# Patient Record
Sex: Female | Born: 1975 | Race: Black or African American | Hispanic: No | Marital: Married | Smoking: Never smoker
Health system: Southern US, Community
[De-identification: ages and names within clinical notes are randomized; demographics above are authoritative.]

## PROBLEM LIST (undated history)

## (undated) DIAGNOSIS — G4730 Sleep apnea, unspecified: Secondary | ICD-10-CM

## (undated) DIAGNOSIS — G709 Myoneural disorder, unspecified: Secondary | ICD-10-CM

## (undated) DIAGNOSIS — K219 Gastro-esophageal reflux disease without esophagitis: Secondary | ICD-10-CM

## (undated) HISTORY — PX: ABDOMINAL HYSTERECTOMY: SHX81

## (undated) HISTORY — PX: CHOLECYSTECTOMY: SHX55

---

## 2017-01-04 ENCOUNTER — Emergency Department
Admission: EM | Admit: 2017-01-04 | Discharge: 2017-01-04 | Discharge status: Against Medical Advice | Payer: Medicaid Other | Attending: Emergency Medicine | Admitting: Emergency Medicine

## 2017-01-04 DIAGNOSIS — K047 Periapical abscess without sinus: Secondary | ICD-10-CM | POA: Diagnosis not present

## 2017-01-04 DIAGNOSIS — K0889 Other specified disorders of teeth and supporting structures: Secondary | ICD-10-CM | POA: Diagnosis present

## 2017-01-04 MED ORDER — DEXAMETHASONE SODIUM PHOSPHATE 10 MG/ML IJ SOLN
10.0000 mg | Freq: Once | INTRAMUSCULAR | Status: DC
  Filled 2017-01-04: qty 1

## 2017-01-04 MED ORDER — AMOXICILLIN-POT CLAVULANATE 875-125 MG PO TABS: 1.0000 | ORAL_TABLET | Freq: Two times a day (BID) | ORAL | 0 refills | Status: DC

## 2017-01-04 MED ORDER — CLINDAMYCIN PHOSPHATE 600 MG/4ML IJ SOLN
600.0000 mg | Freq: Once | INTRAMUSCULAR | Status: DC
  Filled 2017-01-04: qty 4

## 2017-01-04 MED ORDER — HYDROCODONE-ACETAMINOPHEN 5-325 MG PO TABS
1.0000 | ORAL_TABLET | Freq: Once | ORAL | Status: DC
  Filled 2017-01-04: qty 1

## 2017-01-04 MED ORDER — PREDNISONE 10 MG (21) PO TBPK: ORAL_TABLET | ORAL | 0 refills | Status: DC

## 2017-01-04 MED ORDER — HYDROCODONE-ACETAMINOPHEN 5-325 MG PO TABS: 1.0000 | ORAL_TABLET | Freq: Four times a day (QID) | ORAL | 0 refills | Status: DC | PRN

## 2017-01-04 NOTE — ED Triage Notes (Signed)
Patient reports dental pain for over a week.  Patient states she went to dentist but they refused to pull her teeth.

## 2017-01-04 NOTE — ED Provider Notes (Signed)
[facility] Emergency Department Provider Note   ____________________________________________   I have reviewed the triage vital signs and the nursing notes.   HISTORY  Chief Complaint Dental Pain    HPI Emma Lowe is a 41 y.o. female presents to the emergency department with dental pain along the right and left upper gumline.  Patient reports significant pain with erythema and swelling related to multiple broken teeth and significant dental caries.  Patient recently seen by a dentist in [place] and was scheduled to have 8 teeth pulled this past Thursday. They decided not to pull the teeth and reschedule her appointment.  The patient reported none of the above symptoms while being seen this past Thursday.  She noted onset of the above symptoms over the last 2 days.  Patient reported present swelling along her cheeks and gums was approximately 1-2 times the size of her normal face.  Swelling extended up below the eyes and into the neck.  Patient denied fevers, chills, nausea, vomiting or headache.  She denies any past history of dental infection or dental abscess. Patient denies vision changes, chest pain, chest tightness, shortness of breath, abdominal pain, nausea and vomiting.  No past medical history on file.  There are no active problems to display for this patient.   No past surgical history on file.  Prior to Admission medications   Not on File    Allergies Patient has no allergy information on record.  No family history on file.  Social History Social History  Substance Use Topics  . Smoking status: Not on file  . Smokeless tobacco: Not on file  . Alcohol use Not on file    Review of Systems Constitutional: Negative for fever/chills Eyes: No visual changes. ENT:  Negative for sore throat and for difficulty swallowing.  Positive for right-sided and left-sided dental pain with erythema and swelling. Cardiovascular: Denies chest  pain. Respiratory: Denies shortness of breath. Skin: Negative for rash. Neurological: Negative for headaches. ____________________________________________   PHYSICAL EXAM:  VITAL SIGNS: ED Triage Vitals  Enc Vitals Group     BP 01/04/17 2125 (!) 160/88     Pulse Rate 01/04/17 2125 87     Resp 01/04/17 2125 20     Temp 01/04/17 2125 97.8 F (36.6 C)     Temp Source 01/04/17 2125 Axillary     SpO2 01/04/17 2125 97 %     Weight 01/04/17 2124 300 lb (136.1 kg)     Height 01/04/17 2124 5\' 8"  (1.727 m)     Head Circumference --      Peak Flow --      Pain Score --      Pain Loc --      Pain Edu? --      Excl. in GC? --     Constitutional: Alert and oriented. Well appearing and in no acute distress.  Eyes: Conjunctivae are normal. PERRL. EOMI  Head: Normocephalic and atraumatic. ENT:          Mouth/Throat: Mucous membranes are moist. Oropharynx normal.  Upper and lower gumline is erythematous with edema.  Multiple broken teeth with significant dental caries.  External swelling noted along bilateral cheek and lips. Neck:Supple. No thyromegaly. No stridor.  Cardiovascular: Normal rate, regular rhythm. Respiratory: Normal respiratory effort without tachypnea or retractions. Neurologic: Normal speech and language.  Skin:  Skin is warm, dry and intact. No rash noted. Psychiatric: Mood and affect are normal. Speech and behavior are normal. Patient exhibits  appropriate insight and judgement.  ____________________________________________   LABS (all labs ordered are listed, but only abnormal results are displayed)  Labs Reviewed - No data to display ____________________________________________  EKG none ____________________________________________  RADIOLOGY none ____________________________________________   PROCEDURES  Procedure(s) performed: no    Critical Care performed: no ____________________________________________   INITIAL IMPRESSION / ASSESSMENT AND PLAN  / ED COURSE  Pertinent labs & imaging results that were available during my care of the patient were reviewed by me and considered in my medical decision making (see chart for details).  While nursing was attempting to give patient her medications, patient was unhappy with being given Vicodin.  She stated that she wanted different pain medicines and she was not happy with her treatment.  Patient stated that "Vicodin does nothing for her and it was like being given candy".  Patient was on the phone with her husband, he he verbalized to her to request different treatment or leave the treatment room and go back out to recheck in requesting another provider.  Nursing staff asked the patient once again if she wanted to be treated, she did not clearly answer the question and verbalize that we were not treating her symptoms.  She was offered her treatment which consisted of clindamycin 600 IM, Decadron IM and Vicodin 5 mg-325.  Patient refused all medications and left the treatment room.  Patient then left the facility without signing out or being discharged.  ----------------------------------------- 11:55 PM on 01/04/2017 -----------------------------------------   ____________________________________________   FINAL CLINICAL IMPRESSION(S) / ED DIAGNOSES  Final diagnoses:  Pain, dental  Dental infection       NEW MEDICATIONS STARTED DURING THIS VISIT:  There are no discharge medications for this patient.    Note:  This document was prepared using Dragon voice recognition software and may include unintentional dictation errors.    Clois ComberLittle, Yuan Gann M, PA-C 01/04/17 2359    Phineas SemenGoodman, Graydon, MD 01/05/17 [phone]

## 2017-01-05 NOTE — ED Notes (Addendum)
Unable to pull patient back up in pyxis to waste Norco that she refused; Norco was wasted with Hansel FeinsteinAndrea Bryant, RN, charge nurse

## 2017-01-05 NOTE — ED Notes (Signed)
Pt c/o toothache pain to right side of her mouth for over a week; says her dentist refused to pull any teeth; pt with swelling to the whole right cheek; rates pain 10/10

## 2017-01-05 NOTE — ED Notes (Signed)
Primary nurse Pandora LeiterLaura Allen RN attempted to waste pt refused Norco tablet in the pyxis but was unable to locate pt profile. Pharmacy called and they were unable to help Vernona RiegerLaura RN or this nurse locate pt or find a way to waste the Norco without being able to pull up the pt profile. Unable to show wasted medication without pulling up pt profile which shows what medications were given which will show which medications were pulled up under that pt name. It was advised by pharmacy staff to waste the medication in a sharps container at the pyxis and type a note discussing the events leading up to wasting the medication. This nurse witnessed Pandora LeiterLaura Allen RN place Norco in sharps container.

## 2017-01-05 NOTE — ED Notes (Signed)
In to give pt medications prescribed by PA Little; pt refused Norco as she says "that's baby food"; pt says she takes percocet and the Norco won't help; explained to pt that I also had a steroid shot to give her and that I would go get the prescribed antibiotic after that was given; pt was on the phone with her husband at this time and called the PA in to speak with him;   Traci in to speak with husband on speaker phone to let him know what pt's plan of care would be, as that was the indicated care; husband encouraged pt to take the Norco but she continued to refuse; pt then got on the phone with another person, asking them to come [facility]; told person on phone she sat in the lobby for 2-3 hours and then in the room for 2-3 hours and nothing has been done for her; while pt was on the phone, I left for her to finish her conversation; pt was then seen by PA Little leaving the room and heading for the lobby; pt received no medications before leaving;

## 2017-03-02 ENCOUNTER — Emergency Department: Payer: Medicaid Other

## 2017-03-02 ENCOUNTER — Other Ambulatory Visit: Payer: Self-pay

## 2017-03-02 ENCOUNTER — Emergency Department
Admission: EM | Admit: 2017-03-02 | Discharge: 2017-03-02 | Disposition: A | Discharge status: Home / Self Care | Payer: Medicaid Other | Attending: Emergency Medicine | Admitting: Emergency Medicine

## 2017-03-02 ENCOUNTER — Encounter: Payer: Self-pay | Admitting: Emergency Medicine

## 2017-03-02 DIAGNOSIS — R1032 Left lower quadrant pain: Secondary | ICD-10-CM | POA: Insufficient documentation

## 2017-03-02 DIAGNOSIS — R109 Unspecified abdominal pain: Secondary | ICD-10-CM

## 2017-03-02 LAB — COMPREHENSIVE METABOLIC PANEL
ALBUMIN: 4.1 g/dL (ref 3.5–5.0)
ALT: 21 U/L (ref 14–54)
AST: 27 U/L (ref 15–41)
Alkaline Phosphatase: 95 U/L (ref 38–126)
Anion gap: 7 (ref 5–15)
BUN: 11 mg/dL (ref 6–20)
CHLORIDE: 102 mmol/L (ref 101–111)
CO2: 28 mmol/L (ref 22–32)
CREATININE: 0.8 mg/dL (ref 0.44–1.00)
Calcium: 9 mg/dL (ref 8.9–10.3)
GFR calc Af Amer: >60 mL/min (ref >60)
GFR calc non Af Amer: >60 mL/min (ref >60)
GLUCOSE: 90 mg/dL (ref 65–99)
POTASSIUM: 4.1 mmol/L (ref 3.5–5.1)
Sodium: 137 mmol/L (ref 135–145)
Total Bilirubin: 0.5 mg/dL (ref 0.3–1.2)
Total Protein: 7.7 g/dL (ref 6.5–8.1)

## 2017-03-02 LAB — URINALYSIS, COMPLETE (UACMP) WITH MICROSCOPIC
BACTERIA UA: NONE SEEN
BILIRUBIN URINE: NEGATIVE
Glucose, UA: NEGATIVE mg/dL
HGB URINE DIPSTICK: NEGATIVE
Ketones, ur: NEGATIVE mg/dL
Leukocytes, UA: NEGATIVE
NITRITE: NEGATIVE
PROTEIN: NEGATIVE mg/dL
SPECIFIC GRAVITY, URINE: 1.031 — AB (ref 1.005–1.030)
pH: 5 (ref 5.0–8.0)

## 2017-03-02 LAB — CBC
HEMATOCRIT: 35.8 % (ref 35.0–47.0)
Hemoglobin: 11.7 g/dL — ABNORMAL LOW (ref 12.0–16.0)
MCH: 30.3 pg (ref 26.0–34.0)
MCHC: 32.7 g/dL (ref 32.0–36.0)
MCV: 92.9 fL (ref 80.0–100.0)
PLATELETS: 332 10*3/uL (ref 150–440)
RBC: 3.85 MIL/uL (ref 3.80–5.20)
RDW: 13.8 % (ref 11.5–14.5)
WBC: 8.1 10*3/uL (ref 3.6–11.0)

## 2017-03-02 LAB — LIPASE, BLOOD: Lipase: 33 U/L (ref 11–51)

## 2017-03-02 MED ORDER — MORPHINE SULFATE (PF) 4 MG/ML IV SOLN: 4.0000 mg | Freq: Once | INTRAVENOUS | Status: DC

## 2017-03-02 MED ORDER — OXYCODONE-ACETAMINOPHEN 5-325 MG PO TABS
2.0000 | ORAL_TABLET | Freq: Once | ORAL | Status: AC
  Administered 2017-03-02: 2 via ORAL
  Filled 2017-03-02: qty 2

## 2017-03-02 MED ORDER — ONDANSETRON 4 MG PO TBDP
4.0000 mg | ORAL_TABLET | Freq: Once | ORAL | Status: AC
  Administered 2017-03-02: 4 mg via ORAL

## 2017-03-02 MED ORDER — ONDANSETRON 4 MG PO TBDP
ORAL_TABLET | ORAL | Status: AC
  Filled 2017-03-02: qty 1

## 2017-03-02 MED ORDER — HYDROMORPHONE HCL 1 MG/ML IJ SOLN
1.0000 mg | Freq: Once | INTRAMUSCULAR | Status: AC
  Administered 2017-03-02: 1 mg via INTRAMUSCULAR
  Filled 2017-03-02: qty 1

## 2017-03-02 MED ORDER — IOPAMIDOL (ISOVUE-300) INJECTION 61%
30.0000 mL | Freq: Once | INTRAVENOUS | Status: AC | PRN
  Administered 2017-03-02: 30 mL via ORAL

## 2017-03-02 MED ORDER — SODIUM CHLORIDE 0.9 % IV SOLN: Freq: Once | INTRAVENOUS | Status: DC

## 2017-03-02 NOTE — ED Notes (Signed)
Pt refused to sign for discharge instructions   Pt and family cursing nurse.  Pt threw discharge papers in the sink.  Pt and family also requesting a Press photographercharge nurse and Child psychotherapistsocial worker for a [employer] engine[employer] voucher.  Laney PastorStephanie rn charge nurse aware.

## 2017-03-02 NOTE — ED Notes (Signed)
Pt threw cup of water on floor and walked out.  Pt in lobby talking to bpd.  Charge nurse stephanie rn aware.

## 2017-03-02 NOTE — ED Provider Notes (Signed)
[facility] Emergency Department Provider Note       Time seen: ----------------------------------------- 11:46 AM on 03/02/2017 -----------------------------------------   I have reviewed the triage vital signs and the nursing notes.  HISTORY   Chief Complaint Abdominal Pain    HPI Emma Lowe is a 41 y.o. female with a history of C. difficile colitis who presents to the ED for left flank pain since Saturday.  Patient reports history of kidney infections as well as diverticulitis and C. difficile colitis.  Patient states she has not had C. difficile in the last 3 or 4 years.  She denies a history of kidney stones.  She has diarrhea but no nausea or vomiting.  She has not had fever or chills.  Nothing makes her symptoms better.  No past medical history on file.  There are no active problems to display for this patient.   Past Surgical History:  Procedure Laterality Date  . ABDOMINAL SURGERY      Allergies Patient has no known allergies.  Social History Social History   Tobacco Use  . Smoking status: Never Smoker  . Smokeless tobacco: Never Used  Substance Use Topics  . Alcohol use: No    Frequency: Never  . Drug use: No    Review of Systems Constitutional: Negative for fever. Cardiovascular: Negative for chest pain. Respiratory: Negative for shortness of breath. Gastrointestinal: Positive for flank pain, diarrhea Genitourinary: Negative for dysuria. Musculoskeletal: Negative for back pain. Skin: Negative for rash. Neurological: Negative for headaches, focal weakness or numbness.  All systems negative/normal/unremarkable except as stated in the HPI  ____________________________________________   PHYSICAL EXAM:  VITAL SIGNS: ED Triage Vitals  Enc Vitals Group     BP 03/02/17 1104 (!) 143/85     Pulse Rate 03/02/17 1104 73     Resp 03/02/17 1104 16     Temp 03/02/17 1104 97.9 F (36.6 C)     Temp Source 03/02/17 1104 Oral     SpO2 03/02/17 1104 100 %     Weight 03/02/17 1105 (!) 320 lb (145.2 kg)     Height 03/02/17 1105 5\' 8"  (1.727 m)     Head Circumference --      Peak Flow --      Pain Score 03/02/17 1109 5     Pain Loc --      Pain Edu? --      Excl. in GC? --     Constitutional: Alert and oriented. Well appearing and in no distress.  Morbidly obese Eyes: Conjunctivae are normal. Normal extraocular movements. ENT   Head: Normocephalic and atraumatic.   Nose: No congestion/rhinnorhea.   Mouth/Throat: Mucous membranes are moist.   Neck: No stridor. Cardiovascular: Normal rate, regular rhythm. No murmurs, rubs, or gallops. Respiratory: Normal respiratory effort without tachypnea nor retractions. Breath sounds are clear and equal bilaterally. No wheezes/rales/rhonchi. Gastrointestinal: Left flank and left CVA tenderness, no rebound or guarding.  Normal bowel sounds. Musculoskeletal: Nontender with normal range of motion in extremities. No lower extremity tenderness nor edema. Neurologic:  Normal speech and language. No gross focal neurologic deficits are appreciated.  Skin:  Skin is warm, dry and intact. No rash noted. Psychiatric: Mood and affect are normal. Speech and behavior are normal.  ____________________________________________  ED COURSE:  As part of my medical decision making, I reviewed the following data within the electronic MEDICAL RECORD NUMBER History obtained from family if available, nursing notes, old chart and ekg, as well as notes from prior  ED visits. Patient presented for flank pain, we will assess with labs and imaging as indicated at this time.   Procedures ____________________________________________   LABS (pertinent positives/negatives)  Labs Reviewed  URINALYSIS, COMPLETE (UACMP) WITH MICROSCOPIC - Abnormal; Notable for the following components:      Result Value   Color, Urine YELLOW (*)    APPearance CLEAR (*)    Specific Gravity, Urine 1.031 (*)     Squamous Epithelial / LPF 6-30 (*)    All other components within normal limits  C DIFFICILE QUICK SCREEN W PCR REFLEX  GASTROINTESTINAL PANEL BY PCR, STOOL (REPLACES STOOL CULTURE)  CBC  COMPREHENSIVE METABOLIC PANEL  LIPASE, BLOOD  BLOOD GAS, ARTERIAL    RADIOLOGY  CT renal protocol IMPRESSION: 1. No acute abdominal or pelvic pathology.  ____________________________________________  DIFFERENTIAL DIAGNOSIS   UTI, renal colic, pyelonephritis, C. difficile colitis, diverticulitis, dehydration, electrolyte abnormality  FINAL ASSESSMENT AND PLAN  Flank pain   Plan: Patient had presented for flank pain. Patient's labs are unremarkable. Patient's imaging was also unremarkable.  No clear etiology for her flank pain at this time.  She was given Dilaudid with improvement in her pain.  She states she has chronic back pain but this is different.  At this point I have not found a source for her pain, overall she is stable for outpatient follow-up.   Emily FilbertWilliams, Caili Escalera E, MD   Note: This note was generated in part or whole with voice recognition software. Voice recognition is usually quite accurate but there are transcription errors that can and very often do occur. I apologize for any typographical errors that were not detected and corrected.     Emily FilbertWilliams, Cassady Stanczak E, MD 03/02/17 [phone]

## 2017-03-02 NOTE — ED Triage Notes (Signed)
Pt ambulatory to triage with reports of having abd pain since Saturday, pt with hx of kidney infection and diverticulitis.

## 2017-03-02 NOTE — ED Notes (Signed)
Attempted for IV access without success x1

## 2017-03-06 ENCOUNTER — Emergency Department (HOSPITAL_COMMUNITY)
Admission: EM | Admit: 2017-03-06 | Discharge: 2017-03-07 | Disposition: A | Discharge status: Home / Self Care | Payer: Medicaid Other | Attending: Emergency Medicine | Admitting: Emergency Medicine

## 2017-03-06 DIAGNOSIS — R109 Unspecified abdominal pain: Secondary | ICD-10-CM | POA: Diagnosis present

## 2017-03-06 DIAGNOSIS — R1084 Generalized abdominal pain: Secondary | ICD-10-CM | POA: Insufficient documentation

## 2017-03-06 DIAGNOSIS — R197 Diarrhea, unspecified: Secondary | ICD-10-CM | POA: Diagnosis not present

## 2017-03-06 DIAGNOSIS — R112 Nausea with vomiting, unspecified: Secondary | ICD-10-CM

## 2017-03-06 DIAGNOSIS — R1012 Left upper quadrant pain: Secondary | ICD-10-CM

## 2017-03-06 LAB — URINALYSIS, ROUTINE W REFLEX MICROSCOPIC
BACTERIA UA: NONE SEEN
BILIRUBIN URINE: NEGATIVE
Glucose, UA: NEGATIVE mg/dL
KETONES UR: NEGATIVE mg/dL
Nitrite: NEGATIVE
Protein, ur: NEGATIVE mg/dL
Specific Gravity, Urine: 1.028 (ref 1.005–1.030)
pH: 5 (ref 5.0–8.0)

## 2017-03-06 NOTE — ED Triage Notes (Signed)
Pt complains of LLQ abdominal pain since 12/15, which she states has gotten worse. Pt also has n/v/d. Pt has hx of diverticulitis and C. Diff.

## 2017-03-06 NOTE — ED Notes (Signed)
Pt requested that she would prefer to have blood drawn once she gets a room.

## 2017-03-06 NOTE — ED Notes (Signed)
Writer went into patients room and introduced themselves and let patient know what they were there for. Patient said "ok" but informed writer "I am a hard stick and either need an ultrasound IV or an EJ and I do not want to be stuck". Writer confirmed and informed Lillia AbedLindsay, Charity fundraiserN.

## 2017-03-07 LAB — COMPREHENSIVE METABOLIC PANEL
ALK PHOS: 101 U/L (ref 38–126)
ALT: 25 U/L (ref 14–54)
AST: 27 U/L (ref 15–41)
Albumin: 4.4 g/dL (ref 3.5–5.0)
Anion gap: 8 (ref 5–15)
BILIRUBIN TOTAL: 0.4 mg/dL (ref 0.3–1.2)
BUN: 14 mg/dL (ref 6–20)
CALCIUM: 9.5 mg/dL (ref 8.9–10.3)
CHLORIDE: 102 mmol/L (ref 101–111)
CO2: 26 mmol/L (ref 22–32)
CREATININE: 0.88 mg/dL (ref 0.44–1.00)
Glucose, Bld: 102 mg/dL — ABNORMAL HIGH (ref 65–99)
Potassium: 3.6 mmol/L (ref 3.5–5.1)
Sodium: 136 mmol/L (ref 135–145)
Total Protein: 8.4 g/dL — ABNORMAL HIGH (ref 6.5–8.1)

## 2017-03-07 LAB — CBC
HCT: 37.6 % (ref 36.0–46.0)
Hemoglobin: 12.7 g/dL (ref 12.0–15.0)
MCH: 30.8 pg (ref 26.0–34.0)
MCHC: 33.8 g/dL (ref 30.0–36.0)
MCV: 91 fL (ref 78.0–100.0)
PLATELETS: 313 10*3/uL (ref 150–400)
RBC: 4.13 MIL/uL (ref 3.87–5.11)
RDW: 13.3 % (ref 11.5–15.5)
WBC: 10 10*3/uL (ref 4.0–10.5)

## 2017-03-07 LAB — LIPASE, BLOOD: LIPASE: 32 U/L (ref 11–51)

## 2017-03-07 LAB — I-STAT BETA HCG BLOOD, ED (MC, WL, AP ONLY)

## 2017-03-07 MED ORDER — PROMETHAZINE HCL 25 MG PO TABS: 25.0000 mg | ORAL_TABLET | Freq: Four times a day (QID) | ORAL | 0 refills | Status: DC | PRN

## 2017-03-07 MED ORDER — ONDANSETRON 8 MG PO TBDP
8.0000 mg | ORAL_TABLET | Freq: Once | ORAL | Status: AC
  Administered 2017-03-07: 8 mg via ORAL
  Filled 2017-03-07: qty 1

## 2017-03-07 MED ORDER — ONDANSETRON 8 MG PO TBDP: 8.0000 mg | ORAL_TABLET | Freq: Three times a day (TID) | ORAL | 0 refills | Status: DC | PRN

## 2017-03-07 MED ORDER — PROMETHAZINE HCL 25 MG/ML IJ SOLN
25.0000 mg | Freq: Once | INTRAMUSCULAR | Status: DC
  Filled 2017-03-07: qty 1

## 2017-03-07 MED ORDER — HYDROMORPHONE HCL 1 MG/ML IJ SOLN
1.0000 mg | Freq: Once | INTRAMUSCULAR | Status: AC
  Administered 2017-03-07: 1 mg via INTRAMUSCULAR
  Filled 2017-03-07: qty 1

## 2017-03-07 MED ORDER — SODIUM CHLORIDE 0.9 % IV BOLUS (SEPSIS): 1000.0000 mL | Freq: Once | INTRAVENOUS | Status: DC

## 2017-03-07 MED ORDER — DICYCLOMINE HCL 20 MG PO TABS
20.0000 mg | ORAL_TABLET | Freq: Two times a day (BID) | ORAL | 0 refills | Status: DC
Start: 2017-03-07 — End: 2017-06-13

## 2017-03-07 MED ORDER — ONDANSETRON HCL 4 MG/2ML IJ SOLN
4.0000 mg | Freq: Once | INTRAMUSCULAR | Status: DC
  Filled 2017-03-07: qty 2

## 2017-03-07 NOTE — ED Provider Notes (Signed)
[facility]-EMERGENCY DEPT Provider Note   CSN: [id number] Arrival date & time: 03/06/17  1642     History   Chief Complaint Chief Complaint  Patient presents with  . Abdominal Pain    HPI Emma Lowe is a 41 y.o. female.  Patient presents for left sided abdominal pain persistent x 5 days. She reports nausea with vomiting and diarrhea. No hematemesis or bloody stool. She was seen and evaluated at [facility] on 03/02/17, the day after symptoms began, and, per patient, "they couldn't find anything wrong with me". No fever, chest pain, urinary symptoms, SOB or cough.   The history is provided by the patient. No language interpreter was used.    No past medical history on file.  There are no active problems to display for this patient.   Past Surgical History:  Procedure Laterality Date  . ABDOMINAL SURGERY      OB History    No data available       Home Medications    Prior to Admission medications   Not on File    Family History No family history on file.  Social History Social History   Tobacco Use  . Smoking status: Never Smoker  . Smokeless tobacco: Never Used  Substance Use Topics  . Alcohol use: No    Frequency: Never  . Drug use: No     Allergies   Patient has no known allergies.   Review of Systems Review of Systems  Constitutional: Negative for chills and fever.  Respiratory: Negative.  Negative for shortness of breath.   Cardiovascular: Negative.  Negative for chest pain.  Gastrointestinal: Positive for abdominal pain, diarrhea, nausea and vomiting.  Genitourinary: Negative.  Negative for dysuria.  Musculoskeletal: Negative.  Negative for myalgias.  Skin: Negative.   Neurological: Negative.  Negative for weakness.     Physical Exam Updated Vital Signs BP (!) 127/91 (BP Location: Left Arm)   Pulse 93   Temp 98.6 F (37 C) (Oral)   Resp 18   SpO2 100%   Physical Exam  Constitutional: She appears  well-developed and well-nourished.  HENT:  Head: Normocephalic.  Neck: Normal range of motion. Neck supple.  Cardiovascular: Normal rate and regular rhythm.  Pulmonary/Chest: Effort normal and breath sounds normal. She has no rhonchi. She has no rales.  Abdominal: Soft. Bowel sounds are normal. There is generalized tenderness. There is CVA tenderness. There is no rebound and no guarding.  Musculoskeletal: Normal range of motion.  Neurological: She is alert. No cranial nerve deficit.  Skin: Skin is warm and dry. No rash noted.  Psychiatric: She has a normal mood and affect.     ED Treatments / Results  Labs (all labs ordered are listed, but only abnormal results are displayed) Labs Reviewed  URINALYSIS, ROUTINE W REFLEX MICROSCOPIC - Abnormal; Notable for the following components:      Result Value   Hgb urine dipstick MODERATE (*)    Leukocytes, UA SMALL (*)    Squamous Epithelial / LPF 6-30 (*)    All other components within normal limits  LIPASE, BLOOD  COMPREHENSIVE METABOLIC PANEL  CBC  I-STAT BETA HCG BLOOD, ED ([facility], [facility], [facility] ONLY)   Results for orders placed or performed during the hospital encounter of 03/06/17  Lipase, blood  Result Value Ref Range   Lipase 32 11 - 51 U/L  Comprehensive metabolic panel  Result Value Ref Range   Sodium 136 135 - 145 mmol/L   Potassium 3.6  3.5 - 5.1 mmol/L   Chloride 102 101 - 111 mmol/L   CO2 26 22 - 32 mmol/L   Glucose, Bld 102 (H) 65 - 99 mg/dL   BUN 14 6 - 20 mg/dL   Creatinine, Ser 4.090.88 0.44 - 1.00 mg/dL   Calcium 9.5 8.9 - 81.110.3 mg/dL   Total Protein 8.4 (H) 6.5 - 8.1 g/dL   Albumin 4.4 3.5 - 5.0 g/dL   AST 27 15 - 41 U/L   ALT 25 14 - 54 U/L   Alkaline Phosphatase 101 38 - 126 U/L   Total Bilirubin 0.4 0.3 - 1.2 mg/dL   GFR calc non Af Amer >60 >60 mL/min   GFR calc Af Amer >60 >60 mL/min   Anion gap 8 5 - 15  CBC  Result Value Ref Range   WBC 10.0 4.0 - 10.5 K/uL   RBC 4.13 3.87 - 5.11 MIL/uL   Hemoglobin 12.7 12.0 -  15.0 g/dL   HCT 91.437.6 78.236.0 - 95.646.0 %   MCV 91.0 78.0 - 100.0 fL   MCH 30.8 26.0 - 34.0 pg   MCHC 33.8 30.0 - 36.0 g/dL   RDW 21.313.3 08.611.5 - 57.815.5 %   Platelets 313 150 - 400 K/uL  Urinalysis, Routine w reflex microscopic  Result Value Ref Range   Color, Urine YELLOW YELLOW   APPearance CLEAR CLEAR   Specific Gravity, Urine 1.028 1.005 - 1.030   pH 5.0 5.0 - 8.0   Glucose, UA NEGATIVE NEGATIVE mg/dL   Hgb urine dipstick MODERATE (A) NEGATIVE   Bilirubin Urine NEGATIVE NEGATIVE   Ketones, ur NEGATIVE NEGATIVE mg/dL   Protein, ur NEGATIVE NEGATIVE mg/dL   Nitrite NEGATIVE NEGATIVE   Leukocytes, UA SMALL (A) NEGATIVE   RBC / HPF 0-5 0 - 5 RBC/hpf   WBC, UA 6-30 0 - 5 WBC/hpf   Bacteria, UA NONE SEEN NONE SEEN   Squamous Epithelial / LPF 6-30 (A) NONE SEEN   Mucus PRESENT   I-Stat beta hCG blood, ED  Result Value Ref Range   I-stat hCG, quantitative <5.0 <5 mIU/mL   Comment 3            EKG  EKG Interpretation None       Radiology No results found.  Procedures Procedures (including critical care time)  Medications Ordered in ED Medications  ondansetron (ZOFRAN-ODT) disintegrating tablet 8 mg (not administered)     Initial Impression / Assessment and Plan / ED Course  I have reviewed the triage vital signs and the nursing notes.  Pertinent labs & imaging results that were available during my care of the patient were reviewed by me and considered in my medical decision making (see chart for details).     Patient with left sided abdominal pain x 5 days - generalized abdominal pain on exam. VSS.   Chart reviewed. 03/02/17 CT renal study negative for any pathology. Labs unremarkable. VSS at that time.   She is well appearing. In NAD. Will wait to review repeat labs. Do not feel further imaging is necessary at this time. She is from [place] and is due to return home tomorrow. May consider Cipro/Flagyl with history of diverticulitis.  IV attempted but unsuccessful, even  with IV Team consult and attempt with US. IM medications ordered with PO fluid challenge for hydration. Explained care plan to patient.  Patient is tolerating PO fluids without emesis. No diarrhea in the ED. IM Phenergan provided for patient comfort with continued nausea.  Recheck labs from 12/24 continue to be unremarkable. She is considered stable for discharge home without need for repeat imaging. Dr. Nicanor Alcon aware of care plan and agrees.   Final Clinical Impressions(s) / ED Diagnoses   Final diagnoses:  None   1. Abdominal pain 2. Marca Ancona, D  ED Discharge Orders    None       Elpidio Anis, Cordelia Poche 03/07/17 0345    Palumbo, April, MD 03/07/17 [phone]

## 2017-03-07 NOTE — ED Notes (Signed)
Pt rude to staff throughout stay. Upon discharge writer was going over discharge instructions and pt continued to mumble and cuss. Writer had pt sign out after covering discharge instructions and as pt walked out and called staff "rude ass bitches".

## 2017-03-07 NOTE — Discharge Instructions (Signed)
Your repeat lab studies are normal. There is no indication for repeat imaging tonight. You can be discharged home with Phenergan for nausea and Bentyl for abdominal pain.

## 2017-03-07 NOTE — ED Notes (Signed)
Pt demanding ultrasound IV. Will consult staff capable of ultrasound IV.

## 2017-03-07 NOTE — ED Notes (Signed)
Pt states that she's a hard stick and had an EJ IV last, PA aware and IV consult placed

## 2017-06-13 ENCOUNTER — Other Ambulatory Visit: Payer: Self-pay

## 2017-06-13 ENCOUNTER — Emergency Department (HOSPITAL_COMMUNITY)
Admission: EM | Admit: 2017-06-13 | Discharge: 2017-06-14 | Disposition: A | Discharge status: Home / Self Care | Payer: Medicaid Other | Attending: Emergency Medicine | Admitting: Emergency Medicine

## 2017-06-13 ENCOUNTER — Encounter (HOSPITAL_COMMUNITY): Payer: Self-pay | Admitting: *Deleted

## 2017-06-13 ENCOUNTER — Emergency Department (HOSPITAL_COMMUNITY): Payer: Medicaid Other

## 2017-06-13 DIAGNOSIS — R079 Chest pain, unspecified: Secondary | ICD-10-CM | POA: Diagnosis present

## 2017-06-13 DIAGNOSIS — R0781 Pleurodynia: Secondary | ICD-10-CM | POA: Insufficient documentation

## 2017-06-13 DIAGNOSIS — Z79899 Other long term (current) drug therapy: Secondary | ICD-10-CM | POA: Insufficient documentation

## 2017-06-13 LAB — CBC
HCT: 34.3 % — ABNORMAL LOW (ref 36.0–46.0)
HEMOGLOBIN: 11.2 g/dL — AB (ref 12.0–15.0)
MCH: 30.4 pg (ref 26.0–34.0)
MCHC: 32.7 g/dL (ref 30.0–36.0)
MCV: 93 fL (ref 78.0–100.0)
PLATELETS: 284 10*3/uL (ref 150–400)
RBC: 3.69 MIL/uL — AB (ref 3.87–5.11)
RDW: 13.1 % (ref 11.5–15.5)
WBC: 11.3 10*3/uL — ABNORMAL HIGH (ref 4.0–10.5)

## 2017-06-13 LAB — BASIC METABOLIC PANEL
ANION GAP: 11 (ref 5–15)
BUN: 12 mg/dL (ref 6–20)
CALCIUM: 9.3 mg/dL (ref 8.9–10.3)
CO2: 23 mmol/L (ref 22–32)
Chloride: 106 mmol/L (ref 101–111)
Creatinine, Ser: 0.98 mg/dL (ref 0.44–1.00)
Glucose, Bld: 89 mg/dL (ref 65–99)
Potassium: 3.5 mmol/L (ref 3.5–5.1)
SODIUM: 140 mmol/L (ref 135–145)

## 2017-06-13 LAB — TROPONIN I: Troponin I: <0.03 ng/mL (ref <0.03)

## 2017-06-13 LAB — D-DIMER, QUANTITATIVE (NOT AT ARMC): D DIMER QUANT: 4.7 ug{FEU}/mL — AB (ref 0.00–0.50)

## 2017-06-13 MED ORDER — LIDOCAINE 5 % EX PTCH: 1.0000 | MEDICATED_PATCH | CUTANEOUS | 0 refills | Status: DC

## 2017-06-13 MED ORDER — KETOROLAC TROMETHAMINE 15 MG/ML IJ SOLN
15.0000 mg | Freq: Once | INTRAMUSCULAR | Status: AC
  Administered 2017-06-13: 15 mg via INTRAVENOUS
  Filled 2017-06-13: qty 1

## 2017-06-13 MED ORDER — ACETAMINOPHEN 325 MG PO TABS
650.0000 mg | ORAL_TABLET | Freq: Once | ORAL | Status: AC
  Administered 2017-06-13: 650 mg via ORAL
  Filled 2017-06-13: qty 2

## 2017-06-13 MED ORDER — ENOXAPARIN SODIUM 150 MG/ML ~~LOC~~ SOLN
1.0000 mg/kg | Freq: Once | SUBCUTANEOUS | Status: AC
  Administered 2017-06-14: 145 mg via SUBCUTANEOUS
  Filled 2017-06-13: qty 0.97

## 2017-06-13 MED ORDER — METHOCARBAMOL 500 MG PO TABS: 500.0000 mg | ORAL_TABLET | Freq: Two times a day (BID) | ORAL | 0 refills | Status: DC

## 2017-06-13 MED ORDER — LIDOCAINE 5 % EX PTCH
1.0000 | MEDICATED_PATCH | CUTANEOUS | Status: DC
  Administered 2017-06-13: 1 via TRANSDERMAL
  Filled 2017-06-13: qty 1

## 2017-06-13 MED ORDER — CYCLOBENZAPRINE HCL 10 MG PO TABS
10.0000 mg | ORAL_TABLET | Freq: Once | ORAL | Status: AC
  Administered 2017-06-13: 10 mg via ORAL
  Filled 2017-06-13: qty 1

## 2017-06-13 MED ORDER — IOPAMIDOL (ISOVUE-370) INJECTION 76%
INTRAVENOUS | Status: AC
  Administered 2017-06-13: 100 mL
  Filled 2017-06-13: qty 100

## 2017-06-13 MED ORDER — NAPROXEN 375 MG PO TABS: 375.0000 mg | ORAL_TABLET | Freq: Two times a day (BID) | ORAL | 0 refills | Status: DC

## 2017-06-13 NOTE — ED Triage Notes (Signed)
The pt is c/o lt sided upper chest pain for 40 minutes.  The pain started while she was walking no sob nausea or dizziness. lmp none

## 2017-06-13 NOTE — ED Provider Notes (Signed)
Care assumed from previous provider Dr. Pecola Leisureeese. Please see their note for further details to include full history and physical. To summarize in short pt is a 42-year-old female presents to the ED complaining of pleuritic chest pain.. Case discussed, plan agreed upon.  Time of care handoff was awaiting PE study given elevated d-dimer.  Also awaiting repeat troponin EKG.  CTA chest revealed no PE.  Does note a pulmonary nodule that recommends 6532-month follow-up.  Negative delta troponins.  Repeat EKG shows normal sinus rhythm.  Patient's pain is been controlled with Flexeril, lidocaine patch and Toradol.  Prior provider has asked that I order an outpatient ultrasound of the left lower leg given the patient's complaint of swelling and elevated d-dimer.  Patient will be given 1 dose of subcu Lovenox.  Patient to be discharged home with lidocaine patch, anti-inflammatories and muscle relaxers.  Suspect musculoskeletal in nature given the pain is reproducible on palpation.  Pt is hemodynamically stable, in NAD, & able to ambulate in the ED. Evaluation does not show pathology that would require ongoing emergent intervention or inpatient treatment. I explained the diagnosis to the patient. Pain has been managed & has no complaints prior to dc. Pt is comfortable with above plan and is stable for discharge at this time. All questions were answered prior to disposition. Strict return precautions for f/u to the ED were discussed. Encouraged follow up with PCP.        Rise MuLeaphart, Haylo Fake T, PA-C 06/13/17 2353    Tilden Fossaees, Elizabeth, MD 06/14/17 1153

## 2017-06-13 NOTE — ED Provider Notes (Signed)
[facility]   CSN: [id number] Arrival date & time: 06/13/17  1848     History   Chief Complaint Chief Complaint  Patient presents with  . Chest Pain    HPI Emma Lowe is a 42 y.o. female.  The history is provided by the patient. No language interpreter was used.  Chest Pain      Emma Lowe is a 42 y.o. female who presents to the Emergency Department complaining of chest pain. She reports sharp, right-sided chest pain that began about one hour prior to ED arrival. She has associated shortness of breath. Pain is nonradiating and worse with deep breaths. She denies any fevers, cough, abdominal pain, nausea, vomiting. She also reports one week of left lower extremity swelling. No prior similar symptoms. No recent surgeries. She takes no hormones. She is not a smoker. Symptoms are moderate and constant in nature.  History reviewed. No pertinent past medical history.  There are no active problems to display for this patient.   Past Surgical History:  Procedure Laterality Date  . ABDOMINAL SURGERY       OB History   None      Home Medications    Prior to Admission medications   Medication Sig Start Date End Date Taking? Authorizing Provider  ibuprofen (ADVIL,MOTRIN) 800 MG tablet Take 800 mg by mouth every 8 (eight) hours as needed for headache or mild pain.   Yes [provider]  oxyCODONE-acetaminophen (PERCOCET) 10-325 MG tablet Take 1 tablet by mouth every 8 (eight) hours as needed for pain (severe pain).   Yes [provider]  pantoprazole (PROTONIX) 20 MG tablet Take 20 mg by mouth daily.   Yes [provider]    Family History No family history on file.  Social History Social History   Tobacco Use  . Smoking status: Never Smoker  . Smokeless tobacco: Never Used  Substance Use Topics  . Alcohol use: No    Frequency: Never  . Drug use: No     Allergies    Other   Review of Systems Review of Systems  Cardiovascular: Positive for chest pain.  All other systems reviewed and are negative.    Physical Exam Updated Vital Signs BP (!) 151/112   Pulse (!) 58   Temp 98.2 F (36.8 C) (Oral)   Resp 15   Ht 5\' 9"  (1.753 m)   Wt (!) 145.2 kg (320 lb)   SpO2 100%   BMI 47.26 kg/m   Physical Exam  Constitutional: She is oriented to person, place, and time. She appears well-developed and well-nourished.  HENT:  Head: Normocephalic and atraumatic.  Cardiovascular: Normal rate and regular rhythm.  No murmur heard. Pulmonary/Chest: Effort normal and breath sounds normal. No respiratory distress.  Mild right-sided chest wall tenderness to palpation  Abdominal: Soft. There is no tenderness. There is no rebound and no guarding.  Musculoskeletal: She exhibits no edema or tenderness.  Neurological: She is alert and oriented to person, place, and time.  Skin: Skin is warm and dry.  Psychiatric: She has a normal mood and affect. Her behavior is normal.  Nursing note and vitals reviewed.    ED Treatments / Results  Labs (all labs ordered are listed, but only abnormal results are displayed) Labs Reviewed  CBC - Abnormal; Notable for the following components:      Result Value   WBC 11.3 (*)    RBC 3.69 (*)    Hemoglobin  11.2 (*)    HCT 34.3 (*)    All other components within normal limits  D-DIMER, QUANTITATIVE (NOT AT [facility]) - Abnormal; Notable for the following components:   D-Dimer, Quant 4.70 (*)    All other components within normal limits  BASIC METABOLIC PANEL  TROPONIN I    EKG EKG Interpretation  Date/Time:  Saturday June 13 2017 18:53:48 EDT Ventricular Rate:  71 PR Interval:  190 QRS Duration: 70 QT Interval:  386 QTC Calculation: 419 R Axis:   61 Text Interpretation:  Normal sinus rhythm Cannot rule out Anterior infarct , age undetermined Abnormal ECG no prior available for comparison.  Artifact Confirmed by Tilden Fossa [id number]) on 06/13/2017 7:08:52 PM   Radiology Dg Chest 2 View  Result Date: 06/13/2017 CLINICAL DATA:  Right-sided chest pain for 1 hour. EXAM: CHEST - 2 VIEW COMPARISON:  None. FINDINGS: The heart size and mediastinal contours are within normal limits. Both lungs are clear. No pleural effusion or pneumothorax. The visualized skeletal structures are unremarkable. IMPRESSION: No active cardiopulmonary disease. Electronically Signed   By: Amie Portland M.D.   On: 06/13/2017 19:30    Procedures Procedures (including critical care time)  Medications Ordered in ED Medications  acetaminophen (TYLENOL) tablet 650 mg (650 mg Oral Given 06/13/17 2039)     Initial Impression / Assessment and Plan / ED Course  I have reviewed the triage vital signs and the nursing notes.  Pertinent labs & imaging results that were available during my care of the patient were reviewed by me and considered in my medical decision making (see chart for details).     Pt here for evaluation of right sided pleuritic chest pain.  She is nontoxic on exam with no respiratory distress.  Pain is reproducible with palpation.  Ddimer obtained given complaint of asymmetric lower extremity edema. Ddimer is elevated - plan to obtain CT to rule out PE.  Patient care transferred pending CT, repeat trop.  If imaging and labs are negative plan to d/c home with return for lower extremity venous ultrasound tomorrow.    Final Clinical Impressions(s) / ED Diagnoses   Final diagnoses:  Pleuritic chest pain    ED Discharge Orders    None       Tilden Fossa, MD 06/13/17 2115

## 2017-06-13 NOTE — Discharge Instructions (Signed)
Your lab work and imaging has been reassuring.  No signs of a blood clot.  Your heart test are normal.  Your chest pain may be related to musculoskeletal pain.  Please take the Naproxen as prescribed for pain. Do not take any additional NSAIDs including Motrin, Aleve, Ibuprofen, Advil. Please the the robaxin for muscle relaxation. This medication will make you drowsy so avoid situation that could place you in danger.   I have discussed the pulmonary nodule and that recommends 59-month follow-up.  You did not need to follow-up with a primary care doctor.  Return to the ED with any worsening symptoms.  Have given you an outpatient ultrasound of your leg for your leg swelling.  Make sure you return to [facility] at 8:00 in the morning for your ultrasound.

## 2017-06-13 NOTE — ED Notes (Signed)
Patient transported to X-ray 

## 2017-06-14 ENCOUNTER — Ambulatory Visit (HOSPITAL_COMMUNITY): Admission: RE | Admit: 2017-06-14 | Payer: Medicaid Other | Source: Ambulatory Visit

## 2017-06-23 ENCOUNTER — Inpatient Hospital Stay (HOSPITAL_COMMUNITY)
Admission: EM | Admit: 2017-06-23 | Discharge: 2017-06-26 | DRG: 603 | Disposition: A | Discharge status: Home / Self Care | Payer: Medicaid Other | Attending: Internal Medicine | Admitting: Internal Medicine

## 2017-06-23 ENCOUNTER — Encounter (HOSPITAL_COMMUNITY): Payer: Self-pay | Admitting: Emergency Medicine

## 2017-06-23 ENCOUNTER — Emergency Department (HOSPITAL_COMMUNITY): Payer: Medicaid Other

## 2017-06-23 DIAGNOSIS — K048 Radicular cyst: Secondary | ICD-10-CM | POA: Diagnosis present

## 2017-06-23 DIAGNOSIS — H532 Diplopia: Secondary | ICD-10-CM | POA: Diagnosis present

## 2017-06-23 DIAGNOSIS — Z91018 Allergy to other foods: Secondary | ICD-10-CM

## 2017-06-23 DIAGNOSIS — L03211 Cellulitis of face: Principal | ICD-10-CM | POA: Diagnosis present

## 2017-06-23 DIAGNOSIS — Z79899 Other long term (current) drug therapy: Secondary | ICD-10-CM

## 2017-06-23 DIAGNOSIS — K219 Gastro-esophageal reflux disease without esophagitis: Secondary | ICD-10-CM | POA: Diagnosis present

## 2017-06-23 DIAGNOSIS — K029 Dental caries, unspecified: Secondary | ICD-10-CM | POA: Diagnosis present

## 2017-06-23 DIAGNOSIS — K047 Periapical abscess without sinus: Secondary | ICD-10-CM | POA: Diagnosis present

## 2017-06-23 DIAGNOSIS — E876 Hypokalemia: Secondary | ICD-10-CM

## 2017-06-23 DIAGNOSIS — Z9049 Acquired absence of other specified parts of digestive tract: Secondary | ICD-10-CM

## 2017-06-23 DIAGNOSIS — Z90710 Acquired absence of both cervix and uterus: Secondary | ICD-10-CM

## 2017-06-23 DIAGNOSIS — R030 Elevated blood-pressure reading, without diagnosis of hypertension: Secondary | ICD-10-CM | POA: Diagnosis present

## 2017-06-23 HISTORY — DX: Sleep apnea, unspecified: G47.30

## 2017-06-23 HISTORY — DX: Myoneural disorder, unspecified: G70.9

## 2017-06-23 HISTORY — DX: Gastro-esophageal reflux disease without esophagitis: K21.9

## 2017-06-23 LAB — I-STAT TROPONIN, ED: Troponin i, poc: 0 ng/mL (ref 0.00–0.08)

## 2017-06-23 LAB — CBC
HCT: 34.8 % — ABNORMAL LOW (ref 36.0–46.0)
Hemoglobin: 11.4 g/dL — ABNORMAL LOW (ref 12.0–15.0)
MCH: 30.5 pg (ref 26.0–34.0)
MCHC: 32.8 g/dL (ref 30.0–36.0)
MCV: 93 fL (ref 78.0–100.0)
PLATELETS: 271 10*3/uL (ref 150–400)
RBC: 3.74 MIL/uL — ABNORMAL LOW (ref 3.87–5.11)
RDW: 13.1 % (ref 11.5–15.5)
WBC: 10.2 10*3/uL (ref 4.0–10.5)

## 2017-06-23 LAB — PROTIME-INR
INR: 0.96
PROTHROMBIN TIME: 12.7 s (ref 11.4–15.2)

## 2017-06-23 LAB — BASIC METABOLIC PANEL
Anion gap: 9 (ref 5–15)
BUN: 12 mg/dL (ref 6–20)
CHLORIDE: 104 mmol/L (ref 101–111)
CO2: 26 mmol/L (ref 22–32)
CREATININE: 0.93 mg/dL (ref 0.44–1.00)
Calcium: 9.3 mg/dL (ref 8.9–10.3)
GFR calc Af Amer: >60 mL/min (ref >60)
GFR calc non Af Amer: >60 mL/min (ref >60)
GLUCOSE: 97 mg/dL (ref 65–99)
Potassium: 4.2 mmol/L (ref 3.5–5.1)
Sodium: 139 mmol/L (ref 135–145)

## 2017-06-23 LAB — SEDIMENTATION RATE: SED RATE: 46 mm/h — AB (ref 0–22)

## 2017-06-23 LAB — C-REACTIVE PROTEIN: CRP: 2.5 mg/dL — ABNORMAL HIGH (ref <1.0)

## 2017-06-23 LAB — HCG, QUANTITATIVE, PREGNANCY: HCG, BETA CHAIN, QUANT, S: 1 m[IU]/mL (ref <5)

## 2017-06-23 LAB — PROCALCITONIN

## 2017-06-23 LAB — LACTIC ACID, PLASMA: Lactic Acid, Venous: 1 mmol/L (ref 0.5–1.9)

## 2017-06-23 MED ORDER — CLINDAMYCIN PHOSPHATE 600 MG/50ML IV SOLN
600.0000 mg | Freq: Once | INTRAVENOUS | Status: AC
  Administered 2017-06-23: 600 mg via INTRAVENOUS
  Filled 2017-06-23: qty 50

## 2017-06-23 MED ORDER — IOPAMIDOL (ISOVUE-300) INJECTION 61%
INTRAVENOUS | Status: AC
  Filled 2017-06-23: qty 100

## 2017-06-23 MED ORDER — ACETAMINOPHEN 325 MG PO TABS
650.0000 mg | ORAL_TABLET | Freq: Four times a day (QID) | ORAL | Status: DC | PRN
  Administered 2017-06-24: 650 mg via ORAL
  Filled 2017-06-23: qty 2

## 2017-06-23 MED ORDER — ACETAMINOPHEN 650 MG RE SUPP: 650.0000 mg | Freq: Four times a day (QID) | RECTAL | Status: DC | PRN

## 2017-06-23 MED ORDER — HYDROMORPHONE HCL 2 MG/ML IJ SOLN
1.0000 mg | INTRAMUSCULAR | Status: DC | PRN
  Administered 2017-06-23 (×2): 1 mg via INTRAVENOUS
  Filled 2017-06-23 (×2): qty 1

## 2017-06-23 MED ORDER — ZOLPIDEM TARTRATE 5 MG PO TABS
5.0000 mg | ORAL_TABLET | Freq: Every evening | ORAL | Status: DC | PRN
  Administered 2017-06-24 – 2017-06-25 (×2): 5 mg via ORAL
  Filled 2017-06-23 (×2): qty 1

## 2017-06-23 MED ORDER — ENOXAPARIN SODIUM 40 MG/0.4ML ~~LOC~~ SOLN: 40.0000 mg | Freq: Every day | SUBCUTANEOUS | Status: DC

## 2017-06-23 MED ORDER — CLINDAMYCIN PHOSPHATE 600 MG/50ML IV SOLN
600.0000 mg | Freq: Three times a day (TID) | INTRAVENOUS | Status: DC
  Filled 2017-06-23: qty 50

## 2017-06-23 MED ORDER — MORPHINE SULFATE (PF) 4 MG/ML IV SOLN
4.0000 mg | Freq: Once | INTRAVENOUS | Status: AC
  Administered 2017-06-23: 4 mg via INTRAMUSCULAR
  Filled 2017-06-23: qty 1

## 2017-06-23 MED ORDER — FENTANYL CITRATE (PF) 100 MCG/2ML IJ SOLN
50.0000 ug | Freq: Once | INTRAMUSCULAR | Status: DC
  Filled 2017-06-23: qty 2

## 2017-06-23 MED ORDER — IBUPROFEN 400 MG PO TABS
800.0000 mg | ORAL_TABLET | Freq: Three times a day (TID) | ORAL | Status: DC | PRN
  Administered 2017-06-24: 800 mg via ORAL
  Filled 2017-06-23: qty 2

## 2017-06-23 MED ORDER — SODIUM CHLORIDE 0.9 % IV BOLUS
1000.0000 mL | Freq: Once | INTRAVENOUS | Status: AC
  Administered 2017-06-23: 1000 mL via INTRAVENOUS

## 2017-06-23 MED ORDER — SODIUM CHLORIDE 0.9 % IV BOLUS
2000.0000 mL | Freq: Once | INTRAVENOUS | Status: AC
  Administered 2017-06-23: 2000 mL via INTRAVENOUS

## 2017-06-23 MED ORDER — OXYCODONE-ACETAMINOPHEN 5-325 MG PO TABS
1.0000 | ORAL_TABLET | ORAL | Status: DC | PRN
  Administered 2017-06-23: 1 via ORAL
  Filled 2017-06-23: qty 1

## 2017-06-23 MED ORDER — IOPAMIDOL (ISOVUE-300) INJECTION 61%
100.0000 mL | Freq: Once | INTRAVENOUS | Status: AC | PRN
  Administered 2017-06-23: 75 mL via INTRAVENOUS

## 2017-06-23 MED ORDER — HYDROMORPHONE HCL 2 MG/ML IJ SOLN
0.5000 mg | Freq: Once | INTRAMUSCULAR | Status: AC
Start: 2017-06-23 — End: 2017-06-23
  Administered 2017-06-23: 0.5 mg via INTRAVENOUS
  Filled 2017-06-23: qty 1

## 2017-06-23 MED ORDER — POLYETHYLENE GLYCOL 3350 17 G PO PACK: 17.0000 g | PACK | Freq: Every day | ORAL | Status: DC | PRN

## 2017-06-23 MED ORDER — PANTOPRAZOLE SODIUM 20 MG PO TBEC: 20.0000 mg | DELAYED_RELEASE_TABLET | Freq: Every day | ORAL | Status: DC

## 2017-06-23 MED ORDER — SODIUM CHLORIDE 0.9 % IV SOLN
INTRAVENOUS | Status: DC
  Administered 2017-06-23 – 2017-06-25 (×2): via INTRAVENOUS

## 2017-06-23 MED ORDER — CARISOPRODOL 350 MG PO TABS
350.0000 mg | ORAL_TABLET | Freq: Four times a day (QID) | ORAL | Status: DC | PRN
  Administered 2017-06-24 – 2017-06-26 (×5): 350 mg via ORAL
  Filled 2017-06-23 (×5): qty 1

## 2017-06-23 MED ORDER — OXYCODONE-ACETAMINOPHEN 5-325 MG PO TABS
1.0000 | ORAL_TABLET | Freq: Four times a day (QID) | ORAL | Status: AC | PRN
  Administered 2017-06-23: 1 via ORAL
  Filled 2017-06-23: qty 1

## 2017-06-23 MED ORDER — HYDROMORPHONE HCL 2 MG/ML IJ SOLN
0.5000 mg | Freq: Once | INTRAMUSCULAR | Status: AC
  Administered 2017-06-23: 0.5 mg via INTRAVENOUS
  Filled 2017-06-23: qty 1

## 2017-06-23 MED ORDER — ONDANSETRON HCL 4 MG/2ML IJ SOLN
4.0000 mg | Freq: Four times a day (QID) | INTRAMUSCULAR | Status: DC | PRN
Start: 2017-06-23 — End: 2017-06-26
  Administered 2017-06-24 – 2017-06-26 (×6): 4 mg via INTRAVENOUS
  Filled 2017-06-23 (×6): qty 2

## 2017-06-23 MED ORDER — ONDANSETRON HCL 4 MG PO TABS
4.0000 mg | ORAL_TABLET | Freq: Four times a day (QID) | ORAL | Status: DC | PRN
Start: 2017-06-23 — End: 2017-06-26

## 2017-06-23 NOTE — ED Provider Notes (Addendum)
[facility] EMERGENCY DEPARTMENT Provider Note   CSN: [id number] Arrival date & time: 06/23/17  1009     History   Chief Complaint Chief Complaint  Patient presents with  . Abscess    HPI Emma Lowe is a 42 y.o. female.  HPI   42 year old female presents today with complaints of facial swelling.  Patient notes several month history of left upper dental pain.  She notes she has been to the dentist several times and placed on antibiotics.  She notes this improves the swelling and then has recurrence after she stops taking the antibiotics.  She notes she is going to have her teeth pulled in 6 days but does not feel she can wait that long.  She notes over the last week she has had progressive swelling of the left upper gumline and face.  She notes she was placed on antibiotics yesterday which have not improved her symptoms.  She denies any fever, reports that she is able to range her jaw but has pain in the upper dentition.  She denies any swelling of the neck, notes that the swelling has spread up to the left.  Orbital area.  She denies any pain for ocular movements were more ocular discharge.    Past Medical History:  Diagnosis Date  . GERD (gastroesophageal reflux disease)     Patient Active Problem List   Diagnosis Date Noted  . Dental abscess 06/23/2017  . Facial cellulitis 06/23/2017  . GERD (gastroesophageal reflux disease)     Past Surgical History:  Procedure Laterality Date  . ABDOMINAL SURGERY       OB History   None      Home Medications    Prior to Admission medications   Medication Sig Start Date End Date Taking? Authorizing Provider  carisoprodol (SOMA) 350 MG tablet Take 350 mg by mouth 4 (four) times daily as needed for muscle spasms.   Yes [provider]  cephALEXin (KEFLEX) 500 MG capsule Take 1,000 mg by mouth 2 (two) times daily. For 10 days 06/22/17  Yes [provider]  ibuprofen (ADVIL,MOTRIN) 800 MG  tablet Take 800 mg by mouth every 8 (eight) hours as needed for headache or mild pain.   Yes [provider]  pantoprazole (PROTONIX) 20 MG tablet Take 20 mg by mouth daily.   Yes [provider]  lidocaine (LIDODERM) 5 % Place 1 patch onto the skin daily. Remove & Discard patch within 12 hours or as directed by MD Patient not taking: Reported on 06/23/2017 06/13/17   Demetrios Loll T, PA-C  methocarbamol (ROBAXIN) 500 MG tablet Take 1 tablet (500 mg total) by mouth 2 (two) times daily. Patient not taking: Reported on 06/23/2017 06/13/17   Demetrios Loll T, PA-C  naproxen (NAPROSYN) 375 MG tablet Take 1 tablet (375 mg total) by mouth 2 (two) times daily. Patient not taking: Reported on 06/23/2017 06/13/17   Rise Mu, PA-C    Family History No family history on file.  Social History Social History   Tobacco Use  . Smoking status: Never Smoker  . Smokeless tobacco: Never Used  Substance Use Topics  . Alcohol use: No    Frequency: Never  . Drug use: No     Allergies   Other   Review of Systems Review of Systems  All other systems reviewed and are negative.    Physical Exam Updated Vital Signs BP (!) 163/122   Pulse 96   Temp 99.6 F (  37.6 C) (Oral)   Resp 18   SpO2 98%   Physical Exam  Constitutional: She is oriented to person, place, and time. She appears well-developed and well-nourished.  HENT:  Head: Normocephalic and atraumatic.  Significant soft tissue swelling of the left side of the face extending from the mandible up to the left periocular region, no discharge noted  Gumline palpated with obvious swelling along the front lateral gumline floor the mouth is soft nontender neck is supple full active range of motion with no extension into the neck     Eyes: Pupils are equal, round, and reactive to light. Conjunctivae are normal. Right eye exhibits no discharge. Left eye exhibits no discharge. No scleral icterus.  Pupils equal round  and reactive to light, extraocular movements are intact, brief episode of disconjugate gaze with the left eye, eyes track through all range of motion- no purulent discharge   Neck: Normal range of motion. No JVD present. No tracheal deviation present.  Pulmonary/Chest: Effort normal. No stridor.  Neurological: She is alert and oriented to person, place, and time. Coordination normal.  Psychiatric: She has a normal mood and affect. Her behavior is normal. Judgment and thought content normal.  Nursing note and vitals reviewed.    ED Treatments / Results  Labs (all labs ordered are listed, but only abnormal results are displayed) Labs Reviewed  CBC - Abnormal; Notable for the following components:      Result Value   RBC 3.74 (*)    Hemoglobin 11.4 (*)    HCT 34.8 (*)    All other components within normal limits  BASIC METABOLIC PANEL  I-STAT TROPONIN, ED    EKG None  Radiology Ct Maxillofacial W Contrast  Result Date: 06/23/2017 CLINICAL DATA:  42 y/o F; top left mouth dental pain and facial swelling. EXAM: CT MAXILLOFACIAL WITH CONTRAST TECHNIQUE: Multidetector CT imaging of the maxillofacial structures was performed with intravenous contrast. Multiplanar CT image reconstructions were also generated. CONTRAST:  75mL ISOVUE-300 IOPAMIDOL (ISOVUE-300) INJECTION 61% COMPARISON:  None. FINDINGS: Osseous: Multiple dental caries and periapical cysts of the maxillary teeth. No acute fracture or mandibular dislocation. Orbits: Negative. No traumatic or inflammatory finding. Sinuses: Clear. Soft tissues: There is extensive soft tissue swelling the left cheek, base of nose, and periorbital superficial soft tissues. No extension of inflammation into the deep cervical compartments. Small focus of lucency overlying the left anterior maxillary alveolar bone without discrete rim enhancement may represent a developing 8 mm phlegmon/abscess (series 3, image 38). Limited intracranial: No significant or  unexpected finding. IMPRESSION: 1. Inflammation of soft tissues overlying left-sided maxillary alveolar bone and of the superficial compartments of the left face. No inflammation in the deep facial or cervical compartments. 8 mm probable phlegmon/developing abscess overlying the left anterior maxillary alveolar process indicating dental etiology. 2. Multiple dental caries and periapical cysts of the residual maxillary teeth. Electronically Signed   By: Mitzi HansenLance  Furusawa-Stratton M.D.   On: 06/23/2017 17:41    Procedures Procedures (including critical care time)  Medications Ordered in ED Medications  iopamidol (ISOVUE-300) 61 % injection (has no administration in time range)  clindamycin (CLEOCIN) IVPB 600 mg (600 mg Intravenous New Bag/Given 06/23/17 1850)  oxyCODONE-acetaminophen (PERCOCET/ROXICET) 5-325 MG per tablet 1 tablet (has no administration in time range)  carisoprodol (SOMA) tablet 350 mg (has no administration in time range)  ibuprofen (ADVIL,MOTRIN) tablet 800 mg (has no administration in time range)  pantoprazole (PROTONIX) EC tablet 20 mg (has no administration in time range)  morphine 4 MG/ML injection 4 mg (4 mg Intramuscular Given 06/23/17 1503)  HYDROmorphone (DILAUDID) injection 0.5 mg (0.5 mg Intravenous Given 06/23/17 1549)  HYDROmorphone (DILAUDID) injection 0.5 mg (0.5 mg Intravenous Given 06/23/17 1653)  iopamidol (ISOVUE-300) 61 % injection 100 mL (75 mLs Intravenous Contrast Given 06/23/17 1716)  HYDROmorphone (DILAUDID) injection 0.5 mg (0.5 mg Intravenous Given 06/23/17 1805)     Initial Impression / Assessment and Plan / ED Course  I have reviewed the triage vital signs and the nursing notes.  Pertinent labs & imaging results that were available during my care of the patient were reviewed by me and considered in my medical decision making (see chart for details).      Final Clinical Impressions(s) / ED Diagnoses   Final diagnoses:  Gastroesophageal reflux  disease without esophagitis    Labs: I-STAT troponin, BMP, CBC  Imaging: CT maxillofacial with  Consults: [facility], Ophthalmology Dr. Robin Searing  Therapeutics: Clindamycin, Dilaudid, fentanyl, Percocet  Discharge Meds:   Assessment/Plan: 42 year old female presents today with dental infection.  Patient has no appreciable abscess that would be amenable to drainage does appear she has a 0.8 mm developing area on CT scan.  Patient has reassuring laboratory analysis with no elevation in white count no fever here.  Several rounds of pain medication were given IV with no significant improvement in her symptoms.  Due to ongoing pain, rapid progression of infection I do feel patient would be benefit for hospitalization for IV antibiotics, pain management.  I spoke with hospitalist who agreed for hospital admission for ongoing management.  Patient did note double vision earlier in the day that had resolved.  Later on in her visit she noted again double vision.  On exam patient did have disconjugate gaze temporarily.  With her left eye closed she noted diplopia on the right unaffected eye, and also had diplopia with the other eye open.  She had extra ocular movements that were intact without significant pain.  Her CT scan showed no signs of orbital cellulitis.  Case was discussed with on-call ophthalmologist, no need for further imaging at this time giving CT with reassuring findings, patient would be an outpatient candidate after hospital stay.     ED Discharge Orders    None         Rosalio Loud 06/23/17 2140    Gerhard Munch, MD 06/25/17 [phone]

## 2017-06-23 NOTE — H&P (Signed)
History and Physical    Copper Kirtley PFX:[id number] DOB: 03-10-1976 DOA: 06/23/2017  Referring MD/NP/PA:   PCP: Lysle Pearl, PA   Patient coming from:  The patient is coming from home.  At baseline, pt is independent for most of ADL.   Chief Complaint: left facial pain and swelling and dental pain  HPI: Emma Lowe is a 42 y.o. female with medical history significant of GERD, who presents with left facial pain and swelling and dental pain.  Pt states that she has hx of left upper dental issues for several months, which has worsened recently. She was seen by dentist several times and was placed on antibiotics, with some improvement, but then she has recurrent pain now. She is scheduled to have her teeth pulled on 06/29/17. She states that she has worsening left facial swelling and pain. The patient's constant, 10 out of 10 in severity, sharp, radiating to the left neck and ear. Patient reports left upper dental pain. She has chills and subjective fever. Patient denies chest pain, cough, shortness breath, nausea, vomiting, diarrhea, abdominal pain, symptoms of UTI. Patient states that she has double vision in the left eye since this morning. No unilateral weakness, numbness or tingling in extremities.   ED Course: pt was found to have WBC 10.2, lactic acid 1.0, negative troponin, electrolytes renal function okay, temperature 99.6, tachycardia, oxygen saturation 92% on room air. Patient is placed on MedSurg bed for observation. Ophthalmologist, Dr. Noel Journey was consulted by EDP  Ct-maxillofacial: 1. Inflammation of soft tissues overlying left-sided maxillary alveolar bone and of the superficial compartments of the left face. No inflammation in the deep facial or cervical compartments. 8 mm probable phlegmon/developing abscess overlying the left anterior maxillary alveolar process indicating dental etiology. 2. Multiple dental caries and periapical cysts of the residual maxillary teeth.  Review  of Systems:   General: has sujective fevers, chills, no body weight gain, has poor appetite, has fatigue HEENT: has double vision in left eye, no hearing changes or sore throat. Has left facial swelling and pain. Has dental pain. Respiratory: no dyspnea, coughing, wheezing CV: no chest pain, no palpitations GI: no nausea, vomiting, abdominal pain, diarrhea, constipation GU: no dysuria, burning on urination, increased urinary frequency, hematuria  Ext: no leg edema Neuro: no unilateral weakness, numbness, or tingling, no vision change or hearing loss Skin: no rash, no skin tear. MSK: No muscle spasm, no deformity, no limitation of range of movement in spin. Has L eye diplopia. Heme: No easy bruising.  Travel history: No recent long distant travel.  Allergy:  Allergies  Allergen Reactions  . Other Hives    V8 Gerald Dexter Juice     Past Medical History:  Diagnosis Date  . GERD (gastroesophageal reflux disease)   . 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 Sleep apnea     Past Surgical History:  Procedure Laterality Date  . ABDOMINAL HYSTERECTOMY    . CESAREAN SECTION    . CHOLECYSTECTOMY      Social History:  reports that she has never smoked. She has never used smokeless tobacco. She reports that she does not drink alcohol or use drugs.  Family History:  Family History  Problem Relation Age of Onset  . Diabetes Mellitus II Father      Prior to Admission medications   Medication Sig Start Date End Date Taking? Authorizing Provider  carisoprodol (SOMA) 350 MG tablet Take 350 mg by mouth 4 (four) times daily as needed  for muscle spasms.   Yes [provider]  cephALEXin (KEFLEX) 500 MG capsule Take 1,000 mg by mouth 2 (two) times daily. For 10 days 06/22/17  Yes [provider]  ibuprofen (ADVIL,MOTRIN) 800 MG tablet Take 800 mg by mouth every 8 (eight) hours as needed for headache or mild pain.   Yes [provider]    pantoprazole (PROTONIX) 20 MG tablet Take 20 mg by mouth daily.   Yes [provider]  lidocaine (LIDODERM) 5 % Place 1 patch onto the skin daily. Remove & Discard patch within 12 hours or as directed by MD Patient not taking: Reported on 06/23/2017 06/13/17   Ocie Cornfield T, PA-C  methocarbamol (ROBAXIN) 500 MG tablet Take 1 tablet (500 mg total) by mouth 2 (two) times daily. Patient not taking: Reported on 06/23/2017 06/13/17   Ocie Cornfield T, PA-C  naproxen (NAPROSYN) 375 MG tablet Take 1 tablet (375 mg total) by mouth 2 (two) times daily. Patient not taking: Reported on 06/23/2017 06/13/17   Doristine Devoid, PA-C    Physical Exam: Vitals:   06/24/17 0035 06/24/17 0100 06/24/17 0101 06/24/17 0307  BP:  (!) 175/131  (!) 166/112  Pulse: 99  (!) 101 (!) 106  Resp: 19     Temp:  100 F (37.8 C)  99.7 F (37.6 C)  TempSrc:  Oral  Oral  SpO2: 94%  100% 97%  Weight:   (!) 154.7 kg (341 lb)    General: Not in acute distress HEENT: has left facial swelling and tenderness, extending from the mandible up to the left periocular region, no discharge noted. Has swelling in the gumline of left upper side.        Eyes: PERRL, EOMI, no scleral icterus.       ENT: No discharge from the ears and nose, no pharynx injection, no tonsillar enlargement.        Neck: No JVD, no bruit, no mass felt. Heme: No neck lymph node enlargement. Cardiac: S1/S2, RRR, No murmurs, No gallops or rubs. Respiratory: No rales, wheezing, rhonchi or rubs. GI: Soft, nondistended, nontender, no rebound pain, no organomegaly, BS present. GU: No hematuria Ext: No pitting leg edema bilaterally. 2+DP/PT pulse bilaterally. Musculoskeletal: No joint deformities, No joint redness or warmth, no limitation of ROM in spin. Skin: No rashes.  Neuro: Alert, oriented X3, cranial nerves II-XII grossly intact except for double vision and left deviation of her left eye. Muscle strength 5/5 in all extremities, sensation to  light touch intact. Brachial reflex 2+ bilaterally. Knee reflex 1+ bilaterally. Negative Babinski's sign. Psych: Patient is not psychotic, no suicidal or hemocidal ideation.  Labs on Admission: I have personally reviewed following labs and imaging studies  CBC: Recent Labs  Lab 06/23/17 1030  WBC 10.2  HGB 11.4*  HCT 34.8*  MCV 93.0  PLT 950   Basic Metabolic Panel: Recent Labs  Lab 06/23/17 1030  NA 139  K 4.2  CL 104  CO2 26  GLUCOSE 97  BUN 12  CREATININE 0.93  CALCIUM 9.3   GFR: Estimated Creatinine Clearance: 127.7 mL/min (by C-G formula based on SCr of 0.93 mg/dL). Liver Function Tests: No results for input(s): AST, ALT, ALKPHOS, BILITOT, PROT, ALBUMIN in the last 168 hours. No results for input(s): LIPASE, AMYLASE in the last 168 hours. No results for input(s): AMMONIA in the last 168 hours. Coagulation Profile: Recent Labs  Lab 06/23/17 2000  INR 0.96   Cardiac Enzymes: No results for input(s): CKTOTAL,  CKMB, CKMBINDEX, TROPONINI in the last 168 hours. BNP (last 3 results) No results for input(s): PROBNP in the last 8760 hours. HbA1C: No results for input(s): HGBA1C in the last 72 hours. CBG: No results for input(s): GLUCAP in the last 168 hours. Lipid Profile: No results for input(s): CHOL, HDL, LDLCALC, TRIG, CHOLHDL, LDLDIRECT in the last 72 hours. Thyroid Function Tests: No results for input(s): TSH, T4TOTAL, FREET4, T3FREE, THYROIDAB in the last 72 hours. Anemia Panel: No results for input(s): VITAMINB12, FOLATE, FERRITIN, TIBC, IRON, RETICCTPCT in the last 72 hours. Urine analysis:    Component Value Date/Time   COLORURINE YELLOW 03/06/2017 1816   APPEARANCEUR CLEAR 03/06/2017 1816   LABSPEC 1.028 03/06/2017 1816   PHURINE 5.0 03/06/2017 1816   GLUCOSEU NEGATIVE 03/06/2017 1816   HGBUR MODERATE (A) 03/06/2017 1816   BILIRUBINUR NEGATIVE 03/06/2017 1816   KETONESUR NEGATIVE 03/06/2017 1816   PROTEINUR NEGATIVE 03/06/2017 1816   NITRITE  NEGATIVE 03/06/2017 1816   LEUKOCYTESUR SMALL (A) 03/06/2017 1816   Sepsis Labs: @LABRCNTIP (procalcitonin:4,lacticidven:4) )No results found for this or any previous visit (from the past 240 hour(s)).   Radiological Exams on Admission: Ct Maxillofacial W Contrast  Result Date: 06/23/2017 CLINICAL DATA:  42 y/o F; top left mouth dental pain and facial swelling. EXAM: CT MAXILLOFACIAL WITH CONTRAST TECHNIQUE: Multidetector CT imaging of the maxillofacial structures was performed with intravenous contrast. Multiplanar CT image reconstructions were also generated. CONTRAST:  37m ISOVUE-300 IOPAMIDOL (ISOVUE-300) INJECTION 61% COMPARISON:  None. FINDINGS: Osseous: Multiple dental caries and periapical cysts of the maxillary teeth. No acute fracture or mandibular dislocation. Orbits: Negative. No traumatic or inflammatory finding. Sinuses: Clear. Soft tissues: There is extensive soft tissue swelling the left cheek, base of nose, and periorbital superficial soft tissues. No extension of inflammation into the deep cervical compartments. Small focus of lucency overlying the left anterior maxillary alveolar bone without discrete rim enhancement may represent a developing 8 mm phlegmon/abscess (series 3, image 38). Limited intracranial: No significant or unexpected finding. IMPRESSION: 1. Inflammation of soft tissues overlying left-sided maxillary alveolar bone and of the superficial compartments of the left face. No inflammation in the deep facial or cervical compartments. 8 mm probable phlegmon/developing abscess overlying the left anterior maxillary alveolar process indicating dental etiology. 2. Multiple dental caries and periapical cysts of the residual maxillary teeth. Electronically Signed   By: LKristine GarbeM.D.   On: 06/23/2017 17:41     EKG: Independently reviewed. Sinus rhythm, QTC 456, early R-wave progression   Assessment/Plan Principal Problem:   Dental abscess Active Problems:    GERD (gastroesophageal reflux disease)   Facial cellulitis   Double vision   Left facial cellulitis and dental abscess: Patient does not meet criteria for sepsis. Currently hemodynamically stable. Maxillofacial CT scan did not show inflammation in the deep facial or cervical compartments.   - Will place on Med-surg bed for obs - Empiric antimicrobial treatment with clindamycin - PRN Zofran for nausea, dilaudid and Percocet for pain - Blood cultures x 2  - ESR and CRP - will get Procalcitonin and trend lactic acid levels - IVF: 2.0 of NS bolus in ED, followed by 100 cc/h  GERD: -Protonix  Double vision in left eye: likely due to left facial swelling and cellulitis. Maxillofacial CT showed swelling in periorbital superficial soft tissues. EDP consulted ophthalmologist, Dr. MNoel Journey who thinks there is no need to get MRI at this time and "patient would be an outpatient candidate after hospital stay". -will observe closely while being  treated with Abx.  Elevated Bp: Bp 162/122. No hx of HTN. Likely due to pain. -IV hydralazine when necessary fo BSP>175.   DVT ppx: SQ Lovenox Code Status: Full code Family Communication: None at bed side.   Disposition Plan:  Anticipate discharge back to previous home environment Consults called:  none Admission status:   medical floor/obs         Date of Service 06/24/2017    Ivor Costa [facility] Pager [phone]  If 7PM-7AM, please contact night-coverage [email or web address] Password [facility] 06/24/2017, 4:32 AM

## 2017-06-23 NOTE — ED Triage Notes (Signed)
Patient complains of dental pain two dental abscesses in top left mouth. Was taking PO abx but patient states swelling in face has only increased.

## 2017-06-23 NOTE — ED Notes (Signed)
Patient transported to CT 

## 2017-06-24 ENCOUNTER — Encounter (HOSPITAL_COMMUNITY): Payer: Self-pay | Admitting: *Deleted

## 2017-06-24 ENCOUNTER — Other Ambulatory Visit: Payer: Self-pay

## 2017-06-24 DIAGNOSIS — K047 Periapical abscess without sinus: Secondary | ICD-10-CM

## 2017-06-24 DIAGNOSIS — K048 Radicular cyst: Secondary | ICD-10-CM | POA: Diagnosis present

## 2017-06-24 DIAGNOSIS — L03211 Cellulitis of face: Secondary | ICD-10-CM | POA: Diagnosis present

## 2017-06-24 DIAGNOSIS — Z9049 Acquired absence of other specified parts of digestive tract: Secondary | ICD-10-CM | POA: Diagnosis not present

## 2017-06-24 DIAGNOSIS — K029 Dental caries, unspecified: Secondary | ICD-10-CM | POA: Diagnosis present

## 2017-06-24 DIAGNOSIS — Z79899 Other long term (current) drug therapy: Secondary | ICD-10-CM | POA: Diagnosis not present

## 2017-06-24 DIAGNOSIS — K219 Gastro-esophageal reflux disease without esophagitis: Secondary | ICD-10-CM

## 2017-06-24 DIAGNOSIS — H532 Diplopia: Secondary | ICD-10-CM

## 2017-06-24 DIAGNOSIS — E876 Hypokalemia: Secondary | ICD-10-CM

## 2017-06-24 DIAGNOSIS — R030 Elevated blood-pressure reading, without diagnosis of hypertension: Secondary | ICD-10-CM | POA: Diagnosis present

## 2017-06-24 DIAGNOSIS — Z91018 Allergy to other foods: Secondary | ICD-10-CM | POA: Diagnosis not present

## 2017-06-24 DIAGNOSIS — Z90710 Acquired absence of both cervix and uterus: Secondary | ICD-10-CM | POA: Diagnosis not present

## 2017-06-24 LAB — BASIC METABOLIC PANEL
Anion gap: 8 (ref 5–15)
BUN: 7 mg/dL (ref 6–20)
CO2: 24 mmol/L (ref 22–32)
Calcium: 8.8 mg/dL — ABNORMAL LOW (ref 8.9–10.3)
Chloride: 101 mmol/L (ref 101–111)
Creatinine, Ser: 0.78 mg/dL (ref 0.44–1.00)
GFR calc Af Amer: >60 mL/min (ref >60)
GLUCOSE: 128 mg/dL — AB (ref 65–99)
POTASSIUM: 3.4 mmol/L — AB (ref 3.5–5.1)
Sodium: 133 mmol/L — ABNORMAL LOW (ref 135–145)

## 2017-06-24 LAB — CBC
HCT: 33 % — ABNORMAL LOW (ref 36.0–46.0)
Hemoglobin: 10.9 g/dL — ABNORMAL LOW (ref 12.0–15.0)
MCH: 30.8 pg (ref 26.0–34.0)
MCHC: 33 g/dL (ref 30.0–36.0)
MCV: 93.2 fL (ref 78.0–100.0)
Platelets: 278 10*3/uL (ref 150–400)
RBC: 3.54 MIL/uL — ABNORMAL LOW (ref 3.87–5.11)
RDW: 13.1 % (ref 11.5–15.5)
WBC: 12.5 10*3/uL — ABNORMAL HIGH (ref 4.0–10.5)

## 2017-06-24 LAB — HIV ANTIBODY (ROUTINE TESTING W REFLEX): HIV SCREEN 4TH GENERATION: NONREACTIVE

## 2017-06-24 MED ORDER — OXYCODONE-ACETAMINOPHEN 5-325 MG PO TABS
1.0000 | ORAL_TABLET | ORAL | Status: DC | PRN
  Administered 2017-06-24 – 2017-06-26 (×10): 2 via ORAL
  Filled 2017-06-24 (×10): qty 2

## 2017-06-24 MED ORDER — HYDROMORPHONE HCL 1 MG/ML IJ SOLN
1.0000 mg | INTRAMUSCULAR | Status: DC | PRN
  Administered 2017-06-24 – 2017-06-26 (×13): 1 mg via INTRAVENOUS
  Filled 2017-06-24 (×13): qty 1

## 2017-06-24 MED ORDER — PANTOPRAZOLE SODIUM 40 MG PO TBEC
40.0000 mg | DELAYED_RELEASE_TABLET | Freq: Two times a day (BID) | ORAL | Status: DC
  Administered 2017-06-24 – 2017-06-26 (×4): 40 mg via ORAL
  Filled 2017-06-24 (×4): qty 1

## 2017-06-24 MED ORDER — ENOXAPARIN SODIUM 80 MG/0.8ML ~~LOC~~ SOLN
0.5000 mg/kg | Freq: Every day | SUBCUTANEOUS | Status: DC
  Administered 2017-06-25 – 2017-06-26 (×2): 75 mg via SUBCUTANEOUS
  Filled 2017-06-24 (×3): qty 0.8

## 2017-06-24 MED ORDER — HYDRALAZINE HCL 20 MG/ML IJ SOLN
10.0000 mg | INTRAMUSCULAR | Status: DC | PRN
  Administered 2017-06-24 (×2): 10 mg via INTRAVENOUS
  Filled 2017-06-24 (×2): qty 1

## 2017-06-24 MED ORDER — CLINDAMYCIN PHOSPHATE 600 MG/50ML IV SOLN
600.0000 mg | Freq: Three times a day (TID) | INTRAVENOUS | Status: DC
  Administered 2017-06-24 – 2017-06-26 (×8): 600 mg via INTRAVENOUS
  Filled 2017-06-24 (×7): qty 50

## 2017-06-24 MED ORDER — POTASSIUM CHLORIDE CRYS ER 20 MEQ PO TBCR
40.0000 meq | EXTENDED_RELEASE_TABLET | Freq: Once | ORAL | Status: AC
  Administered 2017-06-24: 40 meq via ORAL
  Filled 2017-06-24: qty 2

## 2017-06-24 MED ORDER — OXYCODONE-ACETAMINOPHEN 5-325 MG PO TABS
1.0000 | ORAL_TABLET | ORAL | Status: AC | PRN
  Administered 2017-06-24: 1 via ORAL
  Filled 2017-06-24: qty 1

## 2017-06-24 MED ORDER — PANTOPRAZOLE SODIUM 40 MG PO TBEC
40.0000 mg | DELAYED_RELEASE_TABLET | Freq: Every day | ORAL | Status: DC
  Administered 2017-06-24: 40 mg via ORAL
  Filled 2017-06-24: qty 1

## 2017-06-24 NOTE — Progress Notes (Signed)
PROGRESS NOTE    Emma Lowe  ZOX:[id number] DOB: 08/13/1975 DOA: 06/23/2017 PCP: Corwin Levins, PA   Brief Narrative:  Is 42 year old female history of GERD presents with left facial pain and swelling as well as dental pain.  Patient noted to have left upper dental issues over several months which had worsened recently.  Patient seen by the dentist several times placed on antibiotics with some improvement however still with recurrent pain.  Patient scheduled to have teeth extracted 06/29/2017.  Patient presented with worsening left facial swelling and pain as well as double vision.  In the ED CBC had a white count of 10.2.  Ophthalmologist was consulted by ED P who felt patient's facial swelling secondary to dental issues.  CT maxillofacial done showed inflammation of soft tissues overlying left-sided maxillary alveolar bone and superficial compartments of the left face.  No inflammation in the deep fascial cervical compartments.  8 mm probable phlegmon/developing abscess over the left anterior maxillary alveolar process indicating dental etiology.  Multiple dental caries and periapical cyst of the residual maxillary teeth.  Patient admitted placed on IV clindamycin.   Assessment & Plan:   Principal Problem:   Facial cellulitis Active Problems:   Dental abscess   GERD (gastroesophageal reflux disease)   Double vision   Hypokalemia  1 left facial cellulitis/dental abscess Patient presented with left facial cellulitis and concern for dental abscess.  CT maxillofacial did show inflammation however no inflammation of the deep fascial or cervical compartments.  Etiology felt to be dental in origin.  Blood cultures pending.  Continue empiric IV clindamycin.  Warm compresses.  IV fluids.  Pain management.  Supportive care.  2.  Gastroesophageal reflux disease PPI.  3.  Double vision in the left eye Secondary to problem #1.  CT maxillofacial shows swelling left periorbital superficial soft  tissues.  EDP consulted ophthalmologist who thinks no need for MRI at this time and could follow-up in the outpatient setting post discharge.  Warm compresses.  Continue empiric IV antibiotics.  4.  Elevated blood pressure Likely secondary to pain.  Hydralazine as needed.   DVT prophylaxis: Lovenox Code Status: Full Family Communication: Updated patient.  No family at bedside. Disposition Plan: Home When clinically improved.   Consultants:   None  Procedures:   CT maxillofacial 06/23/2017    Antimicrobials:   IV clindamycin 06/23/2017     Subjective: States some improvement with left facial swelling however significant pain over the left side of her face from her jaw and neck all the way up to her left temporal region.  Double vision improving per patient.  Objective: Vitals:   06/24/17 1031 06/24/17 1405 06/24/17 1410 06/24/17 1700  BP: (!) 182/112 (!) 182/112 (!) 126/92 (!) 132/96  Pulse: 93 93 86 88  Resp:  18 18   Temp:  98 F (36.7 C)    TempSrc:  Oral    SpO2: 99%  97% 98%  Weight:  (!) 154.7 kg (341 lb)    Height:  5\' 9"  (1.753 m)      Intake/Output Summary (Last 24 hours) at 06/24/2017 1853 Last data filed at 06/24/2017 1500 Gross per 24 hour  Intake 4670 ml  Output 0 ml  Net 4670 ml   Filed Weights   06/24/17 0101 06/24/17 1405  Weight: (!) 154.7 kg (341 lb) (!) 154.7 kg (341 lb)    Examination:  General exam: Left facial swelling.  Respiratory system: Clear to auscultation. Respiratory effort normal. Cardiovascular system: S1 & S2  heard, RRR. No JVD, murmurs, rubs, gallops or clicks. No pedal edema. Gastrointestinal system: Abdomen is nondistended, soft and nontender. No organomegaly or masses felt. Normal bowel sounds heard. Central nervous system: Alert and oriented. No focal neurological deficits. Extremities: Symmetric 5 x 5 power. Skin: No rashes, lesions or ulcers Psychiatry: Judgement and insight appear normal. Mood & affect appropriate.      Data Reviewed: I have personally reviewed following labs and imaging studies  CBC: Recent Labs  Lab 06/23/17 1030 06/24/17 0457  WBC 10.2 12.5*  HGB 11.4* 10.9*  HCT 34.8* 33.0*  MCV 93.0 93.2  PLT 271 278   Basic Metabolic Panel: Recent Labs  Lab 06/23/17 1030 06/24/17 0457  NA 139 133*  K 4.2 3.4*  CL 104 101  CO2 26 24  GLUCOSE 97 128*  BUN 12 7  CREATININE 0.93 0.78  CALCIUM 9.3 8.8*   GFR: Estimated Creatinine Clearance: 148.4 mL/min (by C-G formula based on SCr of 0.78 mg/dL). Liver Function Tests: No results for input(s): AST, ALT, ALKPHOS, BILITOT, PROT, ALBUMIN in the last 168 hours. No results for input(s): LIPASE, AMYLASE in the last 168 hours. No results for input(s): AMMONIA in the last 168 hours. Coagulation Profile: Recent Labs  Lab 06/23/17 2000  INR 0.96   Cardiac Enzymes: No results for input(s): CKTOTAL, CKMB, CKMBINDEX, TROPONINI in the last 168 hours. BNP (last 3 results) No results for input(s): PROBNP in the last 8760 hours. HbA1C: No results for input(s): HGBA1C in the last 72 hours. CBG: No results for input(s): GLUCAP in the last 168 hours. Lipid Profile: No results for input(s): CHOL, HDL, LDLCALC, TRIG, CHOLHDL, LDLDIRECT in the last 72 hours. Thyroid Function Tests: No results for input(s): TSH, T4TOTAL, FREET4, T3FREE, THYROIDAB in the last 72 hours. Anemia Panel: No results for input(s): VITAMINB12, FOLATE, FERRITIN, TIBC, IRON, RETICCTPCT in the last 72 hours. Sepsis Labs: Recent Labs  Lab 06/23/17 1933 06/23/17 2000  PROCALCITON <0.10  --   LATICACIDVEN  --  1.0    Recent Results (from the past 240 hour(s))  Culture, blood (x 2)     Status: None (Preliminary result)   Collection Time: 06/23/17  7:55 PM  Result Value Ref Range Status   Specimen Description BLOOD RIGHT FOREARM  Final   Special Requests   Final    BOTTLES DRAWN AEROBIC AND ANAEROBIC Blood Culture adequate volume   Culture   Final    NO GROWTH  < 24 HOURS Performed at [facility], [address] [place], [place] [address]    Report Status PENDING  Incomplete         Radiology Studies: Ct Maxillofacial W Contrast  Result Date: 06/23/2017 CLINICAL DATA:  42 y/o F; top left mouth dental pain and facial swelling. EXAM: CT MAXILLOFACIAL WITH CONTRAST TECHNIQUE: Multidetector CT imaging of the maxillofacial structures was performed with intravenous contrast. Multiplanar CT image reconstructions were also generated. CONTRAST:  75mL ISOVUE-300 IOPAMIDOL (ISOVUE-300) INJECTION 61% COMPARISON:  None. FINDINGS: Osseous: Multiple dental caries and periapical cysts of the maxillary teeth. No acute fracture or mandibular dislocation. Orbits: Negative. No traumatic or inflammatory finding. Sinuses: Clear. Soft tissues: There is extensive soft tissue swelling the left cheek, base of nose, and periorbital superficial soft tissues. No extension of inflammation into the deep cervical compartments. Small focus of lucency overlying the left anterior maxillary alveolar bone without discrete rim enhancement may represent a developing 8 mm phlegmon/abscess (series 3, image 38). Limited intracranial: No significant or  unexpected finding. IMPRESSION: 1. Inflammation of soft tissues overlying left-sided maxillary alveolar bone and of the superficial compartments of the left face. No inflammation in the deep facial or cervical compartments. 8 mm probable phlegmon/developing abscess overlying the left anterior maxillary alveolar process indicating dental etiology. 2. Multiple dental caries and periapical cysts of the residual maxillary teeth. Electronically Signed   By: Mitzi HansenLance  Furusawa-Stratton M.D.   On: 06/23/2017 17:41        Scheduled Meds: . enoxaparin (LOVENOX) injection  0.5 mg/kg Subcutaneous Daily  . pantoprazole  40 mg Oral BID AC   Continuous Infusions: . sodium chloride 100 mL/hr at 06/23/17 2340  . clindamycin (CLEOCIN) IV Stopped  (06/24/17 1755)     LOS: 0 days    Time spent: 35 minutes    Ramiro Harvestaniel Thompson, MD [facility] Pager [phone]  If 7PM-7AM, please contact night-coverage [email or web address] Password [email or web address] 06/24/2017, 6:53 PM

## 2017-06-24 NOTE — Care Management Note (Addendum)
Case Management Note  Patient Details  Name: Emma Lowe MRN: [id number] Date of Birth: 25-Jun-1975  Subjective/Objective:  Admitted for Dental abscess  Action/Plan: Prior to admission patient lived at home.  Will be returning to the same living situation after discharge.  At discharge, patient has transportation home. Prior to admission patient had dental appointment scheduled for April 22,2019.  NCM will follow for discharge needs. Received consult for PCP.  NCM called [facility] is only accepting Medicare patients.  [facility] is only accepting new pediatric medicaid patients.  Per [facility] [phone], states Dr. Eulah PontMurphy is accepting new patient, NCM called office at [phone] but office closed until Monday due to holiday.  Patient advised to call Dr. Eulah PontMurphy office on Monday.  Called [facility] is accepting new medicaid patients and appointment not required (Walk-in's Welcomed).  In to speak with patient, Patient [place] [facility] [place], [place].  Discussed need for PCP, NCM advised unfortunately MD offices are closed today due to the holiday.  Patient verbalized understanding.  Discussed process for [facility] since patient has (2) children advised that may be a choice, and provided (2) other options.  New patient packets for [facility] placed in patient discharge folder as discussed.  Expected Discharge Date:   06/26/2017 Expected Discharge Plan:  Home/Self Care  In-House Referral:   N/A  Discharge planning Services  CM Consult  Status of Service:  In process, will continue to follow  Yancey FlemingsKimberly R Tiernan Millikin, RN  Nurse case manager  06/24/2017, 10:08 AM

## 2017-06-24 NOTE — Progress Notes (Signed)
Admitted to 5M15. Temp 100, BP 175/131,in excruciating pain "10 out of 10. MD called, meds given, refused to answer questions to be admitted and refused assessment at this time due to pain. Ambulated to bathroom. No obvious skin issues except for facial swelling from abscess, no c/o chest painm, no cough noted. All belongings in closet.

## 2017-06-25 LAB — BASIC METABOLIC PANEL
Anion gap: 9 (ref 5–15)
BUN: 15 mg/dL (ref 6–20)
CHLORIDE: 106 mmol/L (ref 101–111)
CO2: 22 mmol/L (ref 22–32)
CREATININE: 0.92 mg/dL (ref 0.44–1.00)
Calcium: 8.5 mg/dL — ABNORMAL LOW (ref 8.9–10.3)
GFR calc Af Amer: >60 mL/min (ref >60)
GFR calc non Af Amer: >60 mL/min (ref >60)
Glucose, Bld: 99 mg/dL (ref 65–99)
Potassium: 4 mmol/L (ref 3.5–5.1)
SODIUM: 137 mmol/L (ref 135–145)

## 2017-06-25 LAB — CBC WITH DIFFERENTIAL/PLATELET
Basophils Absolute: 0 10*3/uL (ref 0.0–0.1)
Basophils Relative: 0 %
EOS ABS: 0.1 10*3/uL (ref 0.0–0.7)
Eosinophils Relative: 2 %
HCT: 31.8 % — ABNORMAL LOW (ref 36.0–46.0)
HEMOGLOBIN: 10.3 g/dL — AB (ref 12.0–15.0)
Lymphocytes Relative: 25 %
Lymphs Abs: 2.1 10*3/uL (ref 0.7–4.0)
MCH: 30.4 pg (ref 26.0–34.0)
MCHC: 32.4 g/dL (ref 30.0–36.0)
MCV: 93.8 fL (ref 78.0–100.0)
MONO ABS: 0.6 10*3/uL (ref 0.1–1.0)
MONOS PCT: 7 %
NEUTROS PCT: 66 %
Neutro Abs: 5.5 10*3/uL (ref 1.7–7.7)
Platelets: 272 10*3/uL (ref 150–400)
RBC: 3.39 MIL/uL — ABNORMAL LOW (ref 3.87–5.11)
RDW: 13 % (ref 11.5–15.5)
WBC: 8.4 10*3/uL (ref 4.0–10.5)

## 2017-06-25 MED ORDER — DIPHENHYDRAMINE HCL 25 MG PO CAPS
25.0000 mg | ORAL_CAPSULE | Freq: Four times a day (QID) | ORAL | Status: DC | PRN
  Administered 2017-06-25 – 2017-06-26 (×4): 25 mg via ORAL
  Filled 2017-06-25 (×4): qty 1

## 2017-06-25 NOTE — Progress Notes (Signed)
PROGRESS NOTE    Emma Lowe  EAV:[id number] DOB: 08-08-1975 DOA: 06/23/2017 PCP: Corwin LevinsNicholson, Todd, PA   Brief Narrative:  Is 42 year old female history of GERD presents with left facial pain and swelling as well as dental pain.  Patient noted to have left upper dental issues over several months which had worsened recently.  Patient seen by the dentist several times placed on antibiotics with some improvement however still with recurrent pain.  Patient scheduled to have teeth extracted 06/29/2017.  Patient presented with worsening left facial swelling and pain as well as double vision.  In the ED CBC had a white count of 10.2.  Ophthalmologist was consulted by ED P who felt patient's facial swelling secondary to dental issues.  CT maxillofacial done showed inflammation of soft tissues overlying left-sided maxillary alveolar bone and superficial compartments of the left face.  No inflammation in the deep fascial cervical compartments.  8 mm probable phlegmon/developing abscess over the left anterior maxillary alveolar process indicating dental etiology.  Multiple dental caries and periapical cyst of the residual maxillary teeth.  Patient admitted placed on IV clindamycin.   Assessment & Plan:   Principal Problem:   Facial cellulitis Active Problems:   Dental abscess   GERD (gastroesophageal reflux disease)   Double vision   Hypokalemia  1 left facial cellulitis/dental abscess Patient presented with left facial cellulitis and concern for dental abscess.  CT maxillofacial did show inflammation however no inflammation of the deep fascial or cervical compartments.  Etiology felt to be dental in origin.  Blood cultures pending with no growth to date.  Patient improving clinically however not at baseline.  Continue empiric IV clindamycin likely transition to oral clindamycin tomorrow.  Warm compresses.  Saline lock IV fluids.  Pain management.  Supportive care.  2.  Gastroesophageal reflux  disease PPI.  3.  Double vision in the left eye Secondary to problem #1.  CT maxillofacial shows swelling left periorbital superficial soft tissues.  EDP consulted ophthalmologist who thinks no need for MRI at this time and could follow-up in the outpatient setting post discharge.  Double vision improving with decreased swelling.  Continue warm compresses.  Continue empiric IV antibiotics.   4.  Elevated blood pressure Likely secondary to pain.  Pressure improved.  Hydralazine as needed.   DVT prophylaxis: Lovenox Code Status: Full Family Communication: Updated patient.  No family at bedside. Disposition Plan: Home When clinically improved hopefully the next 24 hours.   Consultants:   None  Procedures:   CT maxillofacial 06/23/2017    Antimicrobials:   IV clindamycin 06/23/2017     Subjective: Patient states left facial swelling improving however still with significant pain in her left jaw and neck region.  Double vision improving.   Objective: Vitals:   06/24/17 1700 06/24/17 2045 06/25/17 0609 06/25/17 1036  BP: (!) 132/96 136/78 125/82 126/61  Pulse: 88 85 87 87  Resp:    18  Temp:  98.5 F (36.9 C) 98.4 F (36.9 C) 97.9 F (36.6 C)  TempSrc:  Oral Oral Oral  SpO2: 98% 95% 99% 99%  Weight:  (!) 154.5 kg (340 lb 9.8 oz)    Height:        Intake/Output Summary (Last 24 hours) at 06/25/2017 1322 Last data filed at 06/25/2017 1321 Gross per 24 hour  Intake 3515 ml  Output 0 ml  Net 3515 ml   Filed Weights   06/24/17 0101 06/24/17 1405 06/24/17 2045  Weight: (!) 154.7 kg (341 lb) Marland Kitchen(!)  154.7 kg (341 lb) (!) 154.5 kg (340 lb 9.8 oz)    Examination:  General exam: Left facial swelling decreasing.  Respiratory system: Clear to auscultation bilaterally.  No wheezes, no crackles, no rhonchi.  Respiratory effort normal. Cardiovascular system: The rate rhythm no murmurs rubs or gallops.  No JVD.  No pedal edema.  Gastrointestinal system: Abdomen is soft,  nontender, nondistended, positive bowel sounds.   Central nervous system: Alert and oriented. No focal neurological deficits. Extremities: Symmetric 5 x 5 power. Skin: No rashes, lesions or ulcers Psychiatry: Judgement and insight appear normal. Mood & affect appropriate.     Data Reviewed: I have personally reviewed following labs and imaging studies  CBC: Recent Labs  Lab 06/23/17 1030 06/24/17 0457 06/25/17 0451  WBC 10.2 12.5* 8.4  NEUTROABS  --   --  5.5  HGB 11.4* 10.9* 10.3*  HCT 34.8* 33.0* 31.8*  MCV 93.0 93.2 93.8  PLT 271 278 272   Basic Metabolic Panel: Recent Labs  Lab 06/23/17 1030 06/24/17 0457 06/25/17 0451  NA 139 133* 137  K 4.2 3.4* 4.0  CL 104 101 106  CO2 26 24 22   GLUCOSE 97 128* 99  BUN 12 7 15   CREATININE 0.93 0.78 0.92  CALCIUM 9.3 8.8* 8.5*   GFR: Estimated Creatinine Clearance: 128.9 mL/min (by C-G formula based on SCr of 0.92 mg/dL). Liver Function Tests: No results for input(s): AST, ALT, ALKPHOS, BILITOT, PROT, ALBUMIN in the last 168 hours. No results for input(s): LIPASE, AMYLASE in the last 168 hours. No results for input(s): AMMONIA in the last 168 hours. Coagulation Profile: Recent Labs  Lab 06/23/17 2000  INR 0.96   Cardiac Enzymes: No results for input(s): CKTOTAL, CKMB, CKMBINDEX, TROPONINI in the last 168 hours. BNP (last 3 results) No results for input(s): PROBNP in the last 8760 hours. HbA1C: No results for input(s): HGBA1C in the last 72 hours. CBG: No results for input(s): GLUCAP in the last 168 hours. Lipid Profile: No results for input(s): CHOL, HDL, LDLCALC, TRIG, CHOLHDL, LDLDIRECT in the last 72 hours. Thyroid Function Tests: No results for input(s): TSH, T4TOTAL, FREET4, T3FREE, THYROIDAB in the last 72 hours. Anemia Panel: No results for input(s): VITAMINB12, FOLATE, FERRITIN, TIBC, IRON, RETICCTPCT in the last 72 hours. Sepsis Labs: Recent Labs  Lab 06/23/17 1933 06/23/17 2000  PROCALCITON <0.10   --   LATICACIDVEN  --  1.0    Recent Results (from the past 240 hour(s))  Culture, blood (x 2)     Status: None (Preliminary result)   Collection Time: 06/23/17  7:55 PM  Result Value Ref Range Status   Specimen Description BLOOD RIGHT FOREARM  Final   Special Requests   Final    BOTTLES DRAWN AEROBIC AND ANAEROBIC Blood Culture adequate volume   Culture   Final    NO GROWTH < 24 HOURS Performed at [facility], [address], [place], [place] [address]    Report Status PENDING  Incomplete         Radiology Studies: Ct Maxillofacial W Contrast  Result Date: 06/23/2017 CLINICAL DATA:  42 y/o F; top left mouth dental pain and facial swelling. EXAM: CT MAXILLOFACIAL WITH CONTRAST TECHNIQUE: Multidetector CT imaging of the maxillofacial structures was performed with intravenous contrast. Multiplanar CT image reconstructions were also generated. CONTRAST:  75mL ISOVUE-300 IOPAMIDOL (ISOVUE-300) INJECTION 61% COMPARISON:  None. FINDINGS: Osseous: Multiple dental caries and periapical cysts of the maxillary teeth. No acute fracture or mandibular dislocation. Orbits:  Negative. No traumatic or inflammatory finding. Sinuses: Clear. Soft tissues: There is extensive soft tissue swelling the left cheek, base of nose, and periorbital superficial soft tissues. No extension of inflammation into the deep cervical compartments. Small focus of lucency overlying the left anterior maxillary alveolar bone without discrete rim enhancement may represent a developing 8 mm phlegmon/abscess (series 3, image 38). Limited intracranial: No significant or unexpected finding. IMPRESSION: 1. Inflammation of soft tissues overlying left-sided maxillary alveolar bone and of the superficial compartments of the left face. No inflammation in the deep facial or cervical compartments. 8 mm probable phlegmon/developing abscess overlying the left anterior maxillary alveolar process indicating dental etiology. 2. Multiple  dental caries and periapical cysts of the residual maxillary teeth. Electronically Signed   By: Mitzi Hansen M.D.   On: 06/23/2017 17:41        Scheduled Meds: . enoxaparin (LOVENOX) injection  0.5 mg/kg Subcutaneous Daily  . pantoprazole  40 mg Oral BID AC   Continuous Infusions: . clindamycin (CLEOCIN) IV Stopped (06/25/17 1148)     LOS: 1 day    Time spent: 35 minutes    Ramiro Harvest, MD [facility] Pager [phone]  If 7PM-7AM, please contact night-coverage [email or web address] Password [email or web address] 06/25/2017, 1:22 PM

## 2017-06-25 NOTE — Progress Notes (Signed)
Patient is complaining of generalized itching and requesting Benadryl. MD notified. Orders placed for Benadryl. Medication given. Will continue to monitor.

## 2017-06-26 LAB — BASIC METABOLIC PANEL
Anion gap: 9 (ref 5–15)
BUN: 14 mg/dL (ref 6–20)
CHLORIDE: 106 mmol/L (ref 101–111)
CO2: 23 mmol/L (ref 22–32)
CREATININE: 0.95 mg/dL (ref 0.44–1.00)
Calcium: 8.8 mg/dL — ABNORMAL LOW (ref 8.9–10.3)
GFR calc Af Amer: >60 mL/min (ref >60)
GFR calc non Af Amer: >60 mL/min (ref >60)
Glucose, Bld: 106 mg/dL — ABNORMAL HIGH (ref 65–99)
POTASSIUM: 4.4 mmol/L (ref 3.5–5.1)
Sodium: 138 mmol/L (ref 135–145)

## 2017-06-26 MED ORDER — PANTOPRAZOLE SODIUM 40 MG PO TBEC: 40.0000 mg | DELAYED_RELEASE_TABLET | Freq: Every day | ORAL | 0 refills | Status: AC

## 2017-06-26 MED ORDER — OXYCODONE-ACETAMINOPHEN 5-325 MG PO TABS: 1.0000 | ORAL_TABLET | ORAL | 0 refills | Status: AC | PRN

## 2017-06-26 MED ORDER — ONDANSETRON HCL 4 MG PO TABS: 4.0000 mg | ORAL_TABLET | Freq: Four times a day (QID) | ORAL | 0 refills | Status: AC | PRN

## 2017-06-26 MED ORDER — CLINDAMYCIN HCL 300 MG PO CAPS: 300.0000 mg | ORAL_CAPSULE | Freq: Three times a day (TID) | ORAL | 0 refills | Status: AC

## 2017-06-26 NOTE — Progress Notes (Signed)
Discharge instructions, RX's and follow up appts explained and provided to patient verbalized understanding. Patient left floor via wheel chair accompanied by staff.   Tsering Leaman Lynn, RN  

## 2017-06-26 NOTE — Clinical Social Work Note (Signed)
Patient medically stable for discharge and requested assistance with transport home. Cab voucher provided to transport patient home. CSW signing off as patient discharging home and transportation assistance provided.  Genelle BalVanessa Adelyn Roscher, MSW, LCSW Licensed Clinical Social Worker Clinical Social Work Department [facility] [phone]

## 2017-06-26 NOTE — Discharge Summary (Signed)
Physician Discharge Summary  Emma Lowe ZOX:[id number] DOB: 10-31-1975 DOA: 06/23/2017  PCP: Corwin Levins, PA  Admit date: 06/23/2017 Discharge date: 06/26/2017  Time spent: 60 minutes  Recommendations for Outpatient Follow-up:  1. [facility] to establish new PCP.  On follow-up patient's facial cellulitis will need to be reassessed.  If patient is having continued double vision will need outpatient referral to ophthalmology.  Patient will need a CBC and basic metabolic profile done on follow-up. 2. Follow-up with dentist as previously scheduled on 06/29/2017.   Discharge Diagnoses:  Principal Problem:   Facial cellulitis Active Problems:   Dental abscess   GERD (gastroesophageal reflux disease)   Double vision   Hypokalemia   Discharge Condition: Stable and improved  Diet recommendation: Regular  Filed Weights   06/24/17 1405 06/24/17 2045 06/25/17 2024  Weight: (!) 154.7 kg (341 lb) (!) 154.5 kg (340 lb 9.8 oz) (!) 154.4 kg (340 lb 6.2 oz)    History of present illness:  Per Dr Candie Mile is a 42 y.o. female with medical history significant of GERD, who presented with left facial pain and swelling and dental pain.  Pt stated that she has hx of left upper dental issues for several months, which had worsened recently.She was seen by dentist several times and was placed on antibiotics, with some improvement, but then she has recurrent pain now. She is scheduled to have her teeth pulled on 06/29/17. She stated that she has worsening left facial swelling and pain. The patient's constant, 10 out of 10 in severity, sharp, radiating to the left neck and ear. Patient reported left upper dental pain. She has chills and subjective fever. Patient denied chest pain, cough, shortness breath, nausea, vomiting, diarrhea, abdominal pain, symptoms of UTI. Patient stated that she has double vision in the left eye since the morning of admission.  No unilateral weakness, numbness or tingling in extremities.   ED Course: pt was found to have WBC 10.2, lactic acid 1.0, negative troponin, electrolytes renal function okay, temperature 99.6, tachycardia, oxygen saturation 92% on room air. Patient is placed on MedSurg bed for observation. Ophthalmologist, Dr. Robin Searing was consulted by EDP  Ct-maxillofacial: 1. Inflammation of soft tissues overlying left-sided maxillary alveolar bone and of the superficial compartments of the left face. No inflammation in the deep facial or cervical compartments. 8 mm probable phlegmon/developing abscess overlying the left anterior maxillary alveolar process indicating dental etiology. 2. Multiple dental caries and periapical cysts of the residual maxillary teeth.     Hospital Course:  1 left facial cellulitis/dental abscess Patient presented with left facial cellulitis and concern for dental abscess.  CT maxillofacial did show inflammation however no inflammation of the deep fascial or cervical compartments.  Etiology felt to be dental in origin.  Blood cultures pending with no growth to date throughout the hospitalization.   She was placed on IV clindamycin, warm compresses and pain management with clinical improvement.  Patient remained afebrile.   Leukocytosis had resolved by day of discharge.  Patient was discharged home on 7 days of oral clindamycin and is to follow-up with dentist as previously scheduled.  Patient will follow-up with PCP in the outpatient setting.    2.  Gastroesophageal reflux disease Maintained on a PPI.  3.  Double vision in the left eye Secondary to problem #1.  CT maxillofacial showed swelling left periorbital superficial soft tissues.  EDP consulted ophthalmologist who thinks no need for MRI at this time and  could follow-up in the outpatient setting post discharge.  Double vision improved with decreased swelling, IV antibiotics and warm compresses.  Double vision had resolved by  day of discharge.  Outpatient follow-up with PCP and if continued double vision will need referral to ophthalmology in the outpatient setting.  4.  Elevated blood pressure Likely secondary to pain.    Blood pressure improved with pain management and normal by day of discharge.  Outpatient follow-up with PCP.       Procedures:  CT maxillofacial 06/23/2017      Consultations:  None  Discharge Exam: Vitals:   06/26/17 0510 06/26/17 1030  BP: 122/70 117/67  Pulse: 71 88  Resp: 16 18  Temp: 97.7 F (36.5 C) 97.9 F (36.6 C)  SpO2: 100% 96%    General: NAD.  Decreased left facial swelling. Cardiovascular: RRR Respiratory: CTAB  Discharge Instructions   Discharge Instructions    Diet general   Complete by:  As directed    Increase activity slowly   Complete by:  As directed      Allergies as of 06/26/2017      Reactions   Other Hives   V8 Berry Splash Juice      Medication List    STOP taking these medications   cephALEXin 500 MG capsule Commonly known as:  KEFLEX   lidocaine 5 % Commonly known as:  LIDODERM   methocarbamol 500 MG tablet Commonly known as:  ROBAXIN   naproxen 375 MG tablet Commonly known as:  NAPROSYN     TAKE these medications   carisoprodol 350 MG tablet Commonly known as:  SOMA Take 350 mg by mouth 4 (four) times daily as needed for muscle spasms.   clindamycin 300 MG capsule Commonly known as:  CLEOCIN Take 1 capsule (300 mg total) by mouth 3 (three) times daily for 7 days.   ibuprofen 800 MG tablet Commonly known as:  ADVIL,MOTRIN Take 800 mg by mouth every 8 (eight) hours as needed for headache or mild pain.   ondansetron 4 MG tablet Commonly known as:  ZOFRAN Take 1 tablet (4 mg total) by mouth every 6 (six) hours as needed for nausea.   oxyCODONE-acetaminophen 5-325 MG tablet Commonly known as:  PERCOCET/ROXICET Take 1-2 tablets by mouth every 4 (four) hours as needed for up to 5 days for moderate pain.    pantoprazole 40 MG tablet Commonly known as:  PROTONIX Take 1 tablet (40 mg total) by mouth daily. What changed:    medication strength  how much to take      Allergies  Allergen Reactions  . Other Hives    V8 Nelida Meuse Juice    Follow-up Information    [facility] Follow up.   Why:  (Re: PCP)Complete new patient packet(s) in discharge folder and return to clinic Contact information: [address] [place] [address] [phone]       [facility] Follow up.   Why:  Can call on Monday to set up appointment to establish primary care provider.  This one is Adult practice only Contact information: [address] [place] [address] [phone]       [facility] Follow up.   Why:  Can call on Monday to set up appointment for Primary Care provider Contact information: [address] [place] [address] [phone]        dentist Follow up.   Why:  f/u as scheduled           The results of significant diagnostics from this hospitalization (including imaging, microbiology, ancillary and laboratory) are listed below for reference.    Significant Diagnostic Studies: Dg Chest 2 View  Result Date: 06/13/2017 CLINICAL DATA:  Right-sided chest pain for 1 hour. EXAM: CHEST - 2 VIEW COMPARISON:  None. FINDINGS: The heart size and mediastinal contours are within normal limits. Both lungs are clear. No pleural effusion or pneumothorax. The visualized skeletal structures are unremarkable. IMPRESSION: No active cardiopulmonary disease. Electronically Signed   By: Amie Portland M.D.   On: 06/13/2017 19:30   Ct Angio Chest Pe W/cm &/or Wo Cm  Result Date: 06/13/2017 CLINICAL DATA:  42 y/o  F; left-sided upper chest pain. EXAM: CT ANGIOGRAPHY CHEST WITH CONTRAST TECHNIQUE: Multidetector CT imaging of the chest was performed using the standard protocol during bolus  administration of intravenous contrast. Multiplanar CT image reconstructions and MIPs were obtained to evaluate the vascular anatomy. CONTRAST:  ISOVUE-370 IOPAMIDOL (ISOVUE-370) INJECTION 76% COMPARISON:  06/13/2017 chest radiograph. FINDINGS: Cardiovascular: Satisfactory opacification of the pulmonary arteries to the segmental level. No evidence of pulmonary embolism. Normal heart size. No pericardial effusion. Mediastinum/Nodes: No enlarged mediastinal, hilar, or axillary lymph nodes. Thyroid gland, trachea, and esophagus demonstrate no significant findings. Lungs/Pleura: 13 x 13 mm nodule within the right lower lobe within the azygoesophageal recess (AP by ML series 6, image 58. No consolidation, effusion, or pneumothorax.) Upper Abdomen: No acute abnormality. Musculoskeletal: No chest wall abnormality. No acute or significant osseous findings. Review of the MIP images confirms the above findings. IMPRESSION: 1. No pulmonary embolus identified. 2. 13 mm nodule in the right lower lobe within the azygoesophageal recess. Consider one of the following in 3 months for both low-risk and high-risk individuals: (a) repeat chest CT, (b) follow-up PET-CT, or (c) tissue sampling. This recommendation follows the consensus statement: Guidelines for Management of Incidental Pulmonary Nodules Detected on CT Images: From the Fleischner Society 2017; Radiology 2017; 284:228-243. 3. Otherwise unremarkable CTA of the chest. Electronically Signed   By: Mitzi Hansen M.D.   On: 06/13/2017 22:32   Ct Maxillofacial W Contrast  Result Date: 06/23/2017 CLINICAL DATA:  42 y/o F; top left mouth dental pain and facial swelling. EXAM: CT MAXILLOFACIAL WITH CONTRAST TECHNIQUE: Multidetector CT imaging of the maxillofacial structures was performed with intravenous contrast. Multiplanar CT image reconstructions were also generated. CONTRAST:  75mL ISOVUE-300 IOPAMIDOL (ISOVUE-300) INJECTION 61% COMPARISON:  None. FINDINGS:  Osseous: Multiple dental caries and periapical cysts of the maxillary teeth. No acute fracture or mandibular dislocation. Orbits: Negative. No traumatic or inflammatory finding. Sinuses: Clear. Soft tissues: There is extensive soft tissue swelling the left cheek, base of nose, and periorbital superficial soft tissues. No extension of inflammation into the deep cervical compartments. Small focus of lucency overlying the left anterior maxillary alveolar bone without discrete rim enhancement may represent a developing 8 mm phlegmon/abscess (series 3, image 38). Limited intracranial: No significant or unexpected finding. IMPRESSION: 1. Inflammation of soft tissues overlying left-sided maxillary alveolar bone and of the superficial compartments of the left face. No inflammation in the deep facial or cervical compartments. 8 mm probable phlegmon/developing abscess overlying the left anterior maxillary alveolar process indicating dental etiology. 2. Multiple dental caries and periapical cysts of the residual maxillary teeth. Electronically Signed   By: Mitzi Hansen M.D.   On: 06/23/2017 17:41    Microbiology: Recent Results (from the past 240 hour(s))  Culture,  blood (x 2)     Status: None (Preliminary result)   Collection Time: 06/23/17  7:55 PM  Result Value Ref Range Status   Specimen Description BLOOD RIGHT FOREARM  Final   Special Requests   Final    BOTTLES DRAWN AEROBIC AND ANAEROBIC Blood Culture adequate volume   Culture   Final    NO GROWTH 3 DAYS Performed at [facility], [address] [place], [place] [address]    Report Status PENDING  Incomplete  Culture, blood (x 2)     Status: None (Preliminary result)   Collection Time: 06/24/17  4:57 AM  Result Value Ref Range Status   Specimen Description BLOOD FINGER  Final   Special Requests AEROBIC BOTTLE ONLY Blood Culture adequate volume  Final   Culture   Final    NO GROWTH 2 DAYS Performed at [facility],  [address] [place], [place] [address]    Report Status PENDING  Incomplete     Labs: Basic Metabolic Panel: Recent Labs  Lab 06/23/17 1030 06/24/17 0457 06/25/17 0451 06/26/17 0341  NA 139 133* 137 138  K 4.2 3.4* 4.0 4.4  CL 104 101 106 106  CO2 26 24 22 23   GLUCOSE 97 128* 99 106*  BUN 12 7 15 14   CREATININE 0.93 0.78 0.92 0.95  CALCIUM 9.3 8.8* 8.5* 8.8*   Liver Function Tests: No results for input(s): AST, ALT, ALKPHOS, BILITOT, PROT, ALBUMIN in the last 168 hours. No results for input(s): LIPASE, AMYLASE in the last 168 hours. No results for input(s): AMMONIA in the last 168 hours. CBC: Recent Labs  Lab 06/23/17 1030 06/24/17 0457 06/25/17 0451  WBC 10.2 12.5* 8.4  NEUTROABS  --   --  5.5  HGB 11.4* 10.9* 10.3*  HCT 34.8* 33.0* 31.8*  MCV 93.0 93.2 93.8  PLT 271 278 272   Cardiac Enzymes: No results for input(s): CKTOTAL, CKMB, CKMBINDEX, TROPONINI in the last 168 hours. BNP: BNP (last 3 results) No results for input(s): BNP in the last 8760 hours.  ProBNP (last 3 results) No results for input(s): PROBNP in the last 8760 hours.  CBG: No results for input(s): GLUCAP in the last 168 hours.     Signed:  Ramiro Harvestaniel Zoey Gilkeson MD.  [facility] 06/26/2017, 3:17 PM

## 2017-06-28 LAB — CULTURE, BLOOD (ROUTINE X 2)
CULTURE: NO GROWTH
SPECIAL REQUESTS: ADEQUATE

## 2017-06-29 LAB — CULTURE, BLOOD (ROUTINE X 2)
CULTURE: NO GROWTH
Special Requests: ADEQUATE

## 2018-01-06 ENCOUNTER — Encounter (HOSPITAL_COMMUNITY): Payer: Self-pay | Admitting: Emergency Medicine

## 2018-01-06 ENCOUNTER — Emergency Department (HOSPITAL_COMMUNITY): Payer: Medicaid Other

## 2018-01-06 ENCOUNTER — Emergency Department (HOSPITAL_COMMUNITY)
Admission: EM | Admit: 2018-01-06 | Discharge: 2018-01-06 | Disposition: A | Discharge status: Home / Self Care | Payer: Medicaid Other | Attending: Emergency Medicine | Admitting: Emergency Medicine

## 2018-01-06 ENCOUNTER — Other Ambulatory Visit: Payer: Self-pay

## 2018-01-06 DIAGNOSIS — E876 Hypokalemia: Secondary | ICD-10-CM | POA: Diagnosis not present

## 2018-01-06 DIAGNOSIS — J181 Lobar pneumonia, unspecified organism: Secondary | ICD-10-CM | POA: Insufficient documentation

## 2018-01-06 DIAGNOSIS — R0602 Shortness of breath: Secondary | ICD-10-CM | POA: Diagnosis present

## 2018-01-06 DIAGNOSIS — Z79899 Other long term (current) drug therapy: Secondary | ICD-10-CM | POA: Diagnosis not present

## 2018-01-06 DIAGNOSIS — J189 Pneumonia, unspecified organism: Secondary | ICD-10-CM

## 2018-01-06 LAB — CBC
HEMATOCRIT: 34.5 % — AB (ref 36.0–46.0)
HEMOGLOBIN: 10.7 g/dL — AB (ref 12.0–15.0)
MCH: 30.1 pg (ref 26.0–34.0)
MCHC: 31 g/dL (ref 30.0–36.0)
MCV: 96.9 fL (ref 80.0–100.0)
NRBC: 0 % (ref 0.0–0.2)
PLATELETS: 314 10*3/uL (ref 150–400)
RBC: 3.56 MIL/uL — AB (ref 3.87–5.11)
RDW: 13.4 % (ref 11.5–15.5)
WBC: 9.6 10*3/uL (ref 4.0–10.5)

## 2018-01-06 LAB — BASIC METABOLIC PANEL
ANION GAP: 10 (ref 5–15)
BUN: 12 mg/dL (ref 6–20)
CALCIUM: 9.6 mg/dL (ref 8.9–10.3)
CHLORIDE: 100 mmol/L (ref 98–111)
CO2: 29 mmol/L (ref 22–32)
Creatinine, Ser: 1 mg/dL (ref 0.44–1.00)
GFR calc non Af Amer: >60 mL/min (ref >60)
Glucose, Bld: 101 mg/dL — ABNORMAL HIGH (ref 70–99)
Potassium: 3.2 mmol/L — ABNORMAL LOW (ref 3.5–5.1)
Sodium: 139 mmol/L (ref 135–145)

## 2018-01-06 LAB — POCT I-STAT TROPONIN I: Troponin i, poc: 0 ng/mL (ref 0.00–0.08)

## 2018-01-06 MED ORDER — POTASSIUM CHLORIDE ER 10 MEQ PO TBCR: 10.0000 meq | EXTENDED_RELEASE_TABLET | Freq: Every day | ORAL | 0 refills | Status: AC

## 2018-01-06 MED ORDER — DOXYCYCLINE HYCLATE 100 MG PO CAPS: 100.0000 mg | ORAL_CAPSULE | Freq: Two times a day (BID) | ORAL | 0 refills | Status: AC

## 2018-01-06 MED ORDER — SODIUM CHLORIDE 0.9 % IV SOLN
100.0000 mg | Freq: Once | INTRAVENOUS | Status: DC
  Filled 2018-01-06: qty 100

## 2018-01-06 MED ORDER — KETOROLAC TROMETHAMINE 30 MG/ML IJ SOLN
30.0000 mg | Freq: Once | INTRAMUSCULAR | Status: DC
  Filled 2018-01-06: qty 1

## 2018-01-06 MED ORDER — ONDANSETRON HCL 4 MG/2ML IJ SOLN
4.0000 mg | Freq: Once | INTRAMUSCULAR | Status: AC
  Administered 2018-01-06: 4 mg via INTRAVENOUS
  Filled 2018-01-06: qty 2

## 2018-01-06 MED ORDER — IPRATROPIUM-ALBUTEROL 0.5-2.5 (3) MG/3ML IN SOLN
3.0000 mL | Freq: Once | RESPIRATORY_TRACT | Status: AC
  Administered 2018-01-06: 3 mL via RESPIRATORY_TRACT
  Filled 2018-01-06: qty 3

## 2018-01-06 MED ORDER — DOXYCYCLINE HYCLATE 100 MG PO TABS
100.0000 mg | ORAL_TABLET | Freq: Once | ORAL | Status: DC
  Filled 2018-01-06: qty 1

## 2018-01-06 NOTE — ED Notes (Signed)
Pt staying at [facility].

## 2018-01-06 NOTE — Discharge Instructions (Signed)
Chest x-ray shows that you have a pneumonia. The rest of your blood work and EKG are normal. I have prescribed you antibiotics, Doxycycline, to take for the next 5 days. Be sure to take the full five (5) days.   Your potassium is also low today. You can take OTC supplements for this and I have also called in a prescription for you to take.  Please follow-up with your PCP to follow-up on this ED visit and to have them repeat the blood work.

## 2018-01-06 NOTE — ED Notes (Signed)
No answer when called 

## 2018-01-06 NOTE — ED Notes (Signed)
ATTEMPTED ULTRASOUND IV WITH BLOOD DRAW WITHOUT SUCCESS. REQUESTING ASSISTANCE. JAKE RN RESPONDED

## 2018-01-06 NOTE — ED Provider Notes (Signed)
[facility]-EMERGENCY DEPT Provider Note  CSN: [id number] Arrival date & time: 01/06/18  1129    History   Chief Complaint Chief Complaint  Patient presents with  . Shortness of Breath  . Chest Pain    HPI Emma Lowe is a 42 y.o. female with a medical history of GERD and neuromuscular disorder who presented to the ED for  Shortness of Breath  This is a new problem. The current episode started more than 1 week ago. Associated symptoms include cough and chest pain. Pertinent negatives include no fever, no headaches, no coryza, no rhinorrhea, no sore throat, no swollen glands, no ear pain, no sputum production, no hemoptysis, no wheezing, no syncope, no vomiting, no abdominal pain, no leg pain, no leg swelling and no claudication. Precipitated by: Unknown sick contacts. She has tried beta-agonist inhalers (OTC cold products) for the symptoms. The treatment provided mild relief. She has had no prior hospitalizations. She has had no prior ED visits. She has had no prior ICU admissions. Associated medical issues do not include asthma, COPD, chronic lung disease, PE, heart failure, past MI, DVT or recent surgery.    Past Medical History:  Diagnosis Date  . GERD (gastroesophageal reflux disease)   . 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 Sleep apnea     Patient Active Problem List   Diagnosis Date Noted  . Hypokalemia 06/24/2017  . Dental abscess 06/23/2017  . Facial cellulitis 06/23/2017  . Double vision 06/23/2017  . GERD (gastroesophageal reflux disease)     Past Surgical History:  Procedure Laterality Date  . ABDOMINAL HYSTERECTOMY    . CESAREAN SECTION    . CHOLECYSTECTOMY       OB History   None      Home Medications    Prior to Admission medications   Medication Sig Start Date End Date Taking? Authorizing Provider  ondansetron (ZOFRAN) 4 MG tablet Take 1 tablet (4 mg total) by mouth every 6 (six) hours as needed for  nausea. 06/26/17  Yes Rodolph Bong, MD  oxyCODONE-acetaminophen (PERCOCET) 10-325 MG tablet Take 1 tablet by mouth 4 (four) times daily. 12/15/17  Yes [provider]  pantoprazole (PROTONIX) 40 MG tablet Take 1 tablet (40 mg total) by mouth daily. 06/26/17  Yes Rodolph Bong, MD  pregabalin (LYRICA) 50 MG capsule Take 50 mg by mouth daily.   Yes [provider]  doxycycline (VIBRAMYCIN) 100 MG capsule Take 1 capsule (100 mg total) by mouth 2 (two) times daily for 5 days. 01/06/18 01/11/18  Mortis, Jerrel Ivory I, PA-C  potassium chloride (K-DUR) 10 MEQ tablet Take 1 tablet (10 mEq total) by mouth daily. 01/06/18 02/05/18  Mortis, Sharyon Medicus, PA-C    Family History Family History  Problem Relation Age of Onset  . Diabetes Mellitus II Father     Social History Social History   Tobacco Use  . Smoking status: Never Smoker  . Smokeless tobacco: Never Used  Substance Use Topics  . Alcohol use: No    Frequency: Never  . Drug use: No     Allergies   Other   Review of Systems Review of Systems  Constitutional: Negative for chills and fever.  HENT: Positive for congestion. Negative for ear pain, rhinorrhea and sore throat.   Respiratory: Positive for cough and shortness of breath. Negative for hemoptysis, sputum production, chest tightness and wheezing.   Cardiovascular: Positive for chest pain. Negative for palpitations, claudication, leg swelling  and syncope.  Gastrointestinal: Negative for abdominal pain, nausea and vomiting.  Endocrine: Negative.   Musculoskeletal: Negative.   Skin: Negative.   Neurological: Negative for dizziness, weakness, light-headedness, numbness and headaches.     Physical Exam Updated Vital Signs BP (!) 144/77 (BP Location: Left Arm)   Pulse 65   Temp 98 F (36.7 C) (Oral)   Resp 16   Ht 5\' 9"  (1.753 m)   Wt (!) 149.7 kg   SpO2 100%   BMI 48.73 kg/m   Physical Exam  Constitutional:  Non-toxic appearance. No distress.   Obese  HENT:  Mouth/Throat: Oropharynx is clear and moist. No oropharyngeal exudate or posterior oropharyngeal edema.  Eyes: Pupils are equal, round, and reactive to light. EOM are normal.  Neck: Normal range of motion. Neck supple.  Cardiovascular: Normal rate, regular rhythm, normal heart sounds and intact distal pulses.  Pulmonary/Chest: Effort normal. No tachypnea. No respiratory distress.  Crackles heard in the right upper lobe. Some mild expiratory wheezes heard throughout.  Oxygen saturations > 98% on room air.  Skin: Skin is warm and intact. Capillary refill takes less than 2 seconds. No cyanosis.  Nursing note and vitals reviewed.  ED Treatments / Results  Labs (all labs ordered are listed, but only abnormal results are displayed) Labs Reviewed  BASIC METABOLIC PANEL - Abnormal; Notable for the following components:      Result Value   Potassium 3.2 (*)    Glucose, Bld 101 (*)    All other components within normal limits  CBC - Abnormal; Notable for the following components:   RBC 3.56 (*)    Hemoglobin 10.7 (*)    HCT 34.5 (*)    All other components within normal limits  I-STAT TROPONIN, ED  POCT I-STAT TROPONIN I    EKG EKG Interpretation  Date/Time:  Wednesday January 06 2018 11:39:24 EDT Ventricular Rate:  70 PR Interval:    QRS Duration: 85 QT Interval:  415 QTC Calculation: 448 R Axis:   37 Text Interpretation:  Sinus rhythm Low voltage, precordial leads Abnormal R-wave progression, early transition Baseline wander in lead(s) I V3 V4 V5 V6 No STEMI.  Confirmed by Alona Bene [phone]) on 01/06/2018 11:59:46 AM Also confirmed by Alona Bene [phone]), editor Elita Quick [phone])  on 01/06/2018 12:21:28 PM   Radiology Dg Chest 2 View  Result Date: 01/06/2018 CLINICAL DATA:  Chest pain and shortness of breath EXAM: CHEST - 2 VIEW COMPARISON:  Chest CT June 13, 2017 and chest radiograph June 13, 2017 FINDINGS: There is airspace consolidation in the  posterior segment of the right upper lobe consistent with pneumonia. The lungs elsewhere clear. Heart is upper normal in size with pulmonary vascularity normal. No adenopathy. IMPRESSION: Posterior segment right upper lobe pneumonia. Lungs elsewhere clear. Stable cardiac silhouette. Electronically Signed   By: Bretta Bang III M.D.   On: 01/06/2018 12:40    Procedures Procedures (including critical care time)  Medications Ordered in ED Medications  ketorolac (TORADOL) 30 MG/ML injection 30 mg (30 mg Intravenous Refused 01/06/18 1759)  doxycycline (VIBRAMYCIN) 100 mg in sodium chloride 0.9 % 250 mL IVPB (100 mg Intravenous Refused 01/06/18 1837)  ipratropium-albuterol (DUONEB) 0.5-2.5 (3) MG/3ML nebulizer solution 3 mL (3 mLs Nebulization Given 01/06/18 1700)  ondansetron (ZOFRAN) injection 4 mg (4 mg Intravenous Given 01/06/18 1800)     Initial Impression / Assessment and Plan / ED Course  Triage vital signs and the nursing notes have been reviewed.  Pertinent labs &  imaging results that were available during care of the patient were reviewed and considered in medical decision making (see chart for details).  Patient presents to the ED for chest pain for > 1 week. She also endorses chest pain and congestion. She has tried OTC cold/flu products and albuterol inhaler prescribed by her PCP without much relief. Crackles are heard in the right upper lobe on pulmonary exam which is concerning for PNA given other symptoms. Will order CXR for further evaluation.  Clinical Course as of Jan 07 1847  Wed Jan 06, 2018  1610 EKG showed NSR. No ST elevations/depressions or signs of acute ischemia or infarct. This is reassuring in combination with negative troponin which assists in evaluating and ruling out an acute cardiac process. CXR shows PNA in right upper lobe which is the most likely etiology to complaints.   [GM]  1642 Labs pending. Will initiate CAP abx treatment with doxycycline.    [GM]    1755 RN came to talk this provider regarding the patient. Patient refused to ambulate citing nausea and refused to take the PO doxycycline until she received antiemetic and pain medication.   This provider went to re-evaluate patient. She is resting comfortably in the bed, in no acute distress, non-diaphoretic with normal respirations. Patient herself is not endorsing chest pain, but it is her significant other who continues to repeat "she is in significant pain." Doxycycline refused and instead requests narcotics. Education provided that narcotics are not indicated for patient's current chest discomfort and was educated that chest discomfort is coming from PNA and excessive coughing that patient has been doing. There are no physical exam findings or supportive lab work to indicate that chest pain is cardiac related. Patient and her significant other continue to argue for narcotics. Toradol was offered to patient she refused. Patient and significant other "Toradol isn't going to do anything. She's been here since 11:30 you need to give her something." Narcotics will not be given as they are not clinically indicated.   [GM]  1832 Troponin and CBC are normal. Patient hypokalemic at 3.2 which is decline from her baseline at 4.4. No s/s of hypokalemia on exam or associated changes on EKG. Will advise patient to follow-up with PCP on repeat blood work. Will prescribe potassium supplements.   [GM]  1839 RN alerted this provider that patient is leaving the ED and that patient refused doxycycline.   [GM]    Clinical Course User Index [GM] Mortis, Sharyon Medicus, PA-C   Final Clinical Impressions(s) / ED Diagnoses  1. Community Acquired Pneumonia. Rx for Doxycycline x5 days prescribed. Education provided on OTC and supportive treatment for upper respiratory symptom relief.  2. Hypokalemia. No s/s on exam and no EKG changes. Rx for PO potassium supplements. Advised to follow-up with PCP for repeat blood  work.  Dispo: Home. After thorough clinical evaluation, this patient is determined to be medically stable and can be safely discharged with the previously mentioned treatment and/or outpatient follow-up/referral(s). At this time, there are no other apparent medical conditions that require further screening, evaluation or treatment.   Final diagnoses:  Community acquired pneumonia of right upper lobe of lung (HCC)  Hypokalemia    ED Discharge Orders         Ordered    doxycycline (VIBRAMYCIN) 100 MG capsule  2 times daily     01/06/18 1831    potassium chloride (K-DUR) 10 MEQ tablet  Daily     01/06/18 1837  Dagoberto Ligas I, PA-C 01/06/18 1849    Pricilla Loveless, MD 01/07/18 [phone]

## 2018-01-06 NOTE — ED Notes (Addendum)
PT UP WALKING TO BATHROOM WITHOUT DISTRESS. PT REQUESTING TO LEAVE. PT ENCOURAGED TO STAY AND HAVE ANTIBIOTIC INFUSION. PT INFORMED ANTIBIOTIC CHANGED FROM ORAL TO IV AS DISCUSSED EARLIER. SHE DECLINED STATING SHE NO LONGER WANTED TO WAIT. IT HAD BEEN OVER AN HOUR. I EXPLAINED THE PROCESS HOWEVER THE PATIENT REMAIN COMMITTED TO LEAVING. RX X 1 FOR ANTIBIOTIC GIVEN TO FAMILY MEMBER. PT DECLINED VITALS AT DISCHARGE, ATTEMPTED TO GIVE RX TO PT, SHE DECLINED.  ENCOURAGED TO COME BACK IF HER SYMPTOMS WORSENED. FAMILY MEMBER APPRECIATED THE RX GIVEN

## 2018-01-06 NOTE — ED Notes (Addendum)
REPORTED PT DECLINED BLOOD EARLIER STATING SHE WAS A HARD STICK. INFORMED PT I WOULD BE INCLINED TO USE ULTRASOUND IF PT REQUESTED. PT DECLINED ORAL MEDICATION AT THIS TIME STATES SHE WAS NAUSEATED.

## 2018-01-06 NOTE — ED Triage Notes (Signed)
Per [facility] pt c/o SOB x week. 99-100% on RA. Pt adds chest pains and right arm weakness when arrived to ED.  Pt reports that place that she is staying at gave her Phenergan yesterday for her nausea. Vitals: 150/110, 60HR CBG 113.

## 2018-01-06 NOTE — ED Notes (Signed)
Pt reports that she feels like she "loosing my body and lightheaded". repors too weak to stand at this time, "I will fall".

## 2018-01-06 NOTE — ED Notes (Addendum)
EDPA Provider at bedside. SPEAKING WITH PT AND FAMILY ON PAIN MANAGEMENT. PT OFFERED TORADOL AND DECLINED. PLAN OF CARE DISCUSSED AND RE DISCUSSED WITH THIS WRITER. PT AND FAMILY UNDERSTAND PLAN OF CARE AT PRESENT

## 2018-01-06 NOTE — ED Notes (Signed)
PT DECLINED DISCHARGE PAPERS AND SIGNING

## 2018-01-13 ENCOUNTER — Encounter (HOSPITAL_COMMUNITY): Payer: Self-pay | Admitting: Emergency Medicine

## 2018-01-13 ENCOUNTER — Emergency Department (HOSPITAL_COMMUNITY)
Admission: EM | Admit: 2018-01-13 | Discharge: 2018-01-13 | Disposition: A | Discharge status: Home / Self Care | Payer: Medicaid Other | Attending: Emergency Medicine | Admitting: Emergency Medicine

## 2018-01-13 ENCOUNTER — Other Ambulatory Visit: Payer: Self-pay

## 2018-01-13 ENCOUNTER — Emergency Department (HOSPITAL_COMMUNITY): Payer: Medicaid Other

## 2018-01-13 DIAGNOSIS — R0600 Dyspnea, unspecified: Secondary | ICD-10-CM | POA: Diagnosis present

## 2018-01-13 DIAGNOSIS — R0789 Other chest pain: Secondary | ICD-10-CM | POA: Insufficient documentation

## 2018-01-13 DIAGNOSIS — Z79899 Other long term (current) drug therapy: Secondary | ICD-10-CM | POA: Diagnosis not present

## 2018-01-13 LAB — I-STAT TROPONIN, ED: Troponin i, poc: 0 ng/mL (ref 0.00–0.08)

## 2018-01-13 LAB — CBC
HCT: 39.3 % (ref 36.0–46.0)
HEMOGLOBIN: 12.5 g/dL (ref 12.0–15.0)
MCH: 29.9 pg (ref 26.0–34.0)
MCHC: 31.8 g/dL (ref 30.0–36.0)
MCV: 94 fL (ref 80.0–100.0)
Platelets: 320 10*3/uL (ref 150–400)
RBC: 4.18 MIL/uL (ref 3.87–5.11)
RDW: 13.6 % (ref 11.5–15.5)
WBC: 10.6 10*3/uL — ABNORMAL HIGH (ref 4.0–10.5)
nRBC: 0 % (ref 0.0–0.2)

## 2018-01-13 LAB — BASIC METABOLIC PANEL
Anion gap: 9 (ref 5–15)
BUN: 10 mg/dL (ref 6–20)
CHLORIDE: 103 mmol/L (ref 98–111)
CO2: 25 mmol/L (ref 22–32)
CREATININE: 0.97 mg/dL (ref 0.44–1.00)
Calcium: 9.3 mg/dL (ref 8.9–10.3)
GFR calc Af Amer: >60 mL/min (ref >60)
GFR calc non Af Amer: >60 mL/min (ref >60)
GLUCOSE: 102 mg/dL — AB (ref 70–99)
Potassium: 3.4 mmol/L — ABNORMAL LOW (ref 3.5–5.1)
Sodium: 137 mmol/L (ref 135–145)

## 2018-01-13 MED ORDER — ONDANSETRON 4 MG PO TBDP: 4.0000 mg | ORAL_TABLET | Freq: Three times a day (TID) | ORAL | 0 refills | Status: AC | PRN

## 2018-01-13 MED ORDER — PROMETHAZINE HCL 25 MG/ML IJ SOLN
25.0000 mg | Freq: Once | INTRAMUSCULAR | Status: AC
  Administered 2018-01-13: 25 mg via INTRAVENOUS
  Filled 2018-01-13: qty 1

## 2018-01-13 MED ORDER — ACETAMINOPHEN 500 MG PO TABS: 1000.0000 mg | ORAL_TABLET | Freq: Once | ORAL | Status: DC

## 2018-01-13 MED ORDER — KETOROLAC TROMETHAMINE 30 MG/ML IJ SOLN
30.0000 mg | Freq: Once | INTRAMUSCULAR | Status: AC
  Administered 2018-01-13: 30 mg via INTRAVENOUS
  Filled 2018-01-13: qty 1

## 2018-01-13 NOTE — ED Notes (Signed)
Asking to speak with np again  messagwe given

## 2018-01-13 NOTE — ED Triage Notes (Signed)
Pt. Stated, Emma Lowe had a cold for 2 weeks , its gone and now I have bad chest pain, SOB , today I had some N/V

## 2018-01-13 NOTE — Discharge Instructions (Signed)
Thank you for allowing me to care for you today in the Emergency Department.   You can take 600 mg ibuprofen with food or 650 mg of Tylenol every 6 hours for pain.  Follow-up with your primary care provider if your symptoms do not improve in the next few days.  Let 1 tablet of Zofran dissolve in your tongue every 8 hours as needed for nausea or vomiting.  Return to the emergency department if you develop significantly new or worsening symptoms including high fever, chest pain with sweating, respiratory distress, or other new, concerning symptoms.

## 2018-01-13 NOTE — ED Triage Notes (Signed)
Pt at [facility] a week ago with the same symptoms

## 2018-01-13 NOTE — ED Notes (Signed)
The pa has spoken to the pt  She jerked her discharge papers from me jerked her rx for zofran off the front and  Started saying that this was a fucking racist hospital and she hated this hospital  Left very angry

## 2018-01-13 NOTE — ED Notes (Signed)
Pt c/o pain in her chest  She was given toradol at 1500  The pt reports that the med did not help  A talk with the pa gave me a tylenol order that the pt refused

## 2018-01-13 NOTE — ED Notes (Signed)
I went to dischrrge the pt ansd the female with her reports that the pt does not feel comfortable goling home with her chest pain  She reports that she weas told last week that she had pneumonia  Today the chest xray shows no pneumonia she is in no visible distress

## 2018-01-13 NOTE — ED Notes (Signed)
Did ekg on patient shown to Dr Jeraldine Loots

## 2018-01-13 NOTE — ED Provider Notes (Signed)
[facility] EMERGENCY DEPARTMENT Provider Note   CSN: [id number] Arrival date & time: 01/13/18  1424     History   Chief Complaint Chief Complaint  Patient presents with  . Chest Pain  . Nausea  . Emesis    HPI Emma Lowe is a 42 y.o. female with a h/o of chronic right knee and low back pain on chronic opioids, chronic pain syndrome, morbid obesity, GERD and OSA who presents to the Emergency Department with a chief complaint of dyspnea.  The patient endorses dyspnea, onset 1 week ago and has been gradually worsening since onset with associated "stabbing", central chest pain.  Dyspnea is worse with exertion.  Reports she is getting short of breath walking from room to room.  She states the chest pain is worse when she takes a deep breath.  Aggravating or alleviating factors.  She reports that she had 2 episodes of nonbloody, nonbilious emesis earlier today with nausea and a nonproductive cough for the last week.  She denies fevers, chills, abdominal pain, constipation, diarrhea, headache, dizziness, lightheadedness.  She reports that she was seen in the ED on 01/06/2018 and was diagnosed with pneumonia.  She did not receive antibiotics as she left AMA.  The patient's significant also reports that the patient was supposed to follow-up with her pain management provider earlier today, but missed the bus due to her chest pain. She is rescheduled for an appointment tomorrow.    She is a never smoker. No h/o of asthma or COPD. She does not take OCPs.  No recent surgery or immobilization.  No long travel.  No history of personal or family DVT or PE.  No family history of CVD.  The history is provided by the patient. No language interpreter was used.    Past Medical History:  Diagnosis Date  . GERD (gastroesophageal reflux disease)   . 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 Sleep apnea     Patient Active Problem List   Diagnosis Date Noted  .  Hypokalemia 06/24/2017  . Dental abscess 06/23/2017  . Facial cellulitis 06/23/2017  . Double vision 06/23/2017  . GERD (gastroesophageal reflux disease)     Past Surgical History:  Procedure Laterality Date  . ABDOMINAL HYSTERECTOMY    . CESAREAN SECTION    . CHOLECYSTECTOMY       OB History   None      Home Medications    Prior to Admission medications   Medication Sig Start Date End Date Taking? Authorizing Provider  ondansetron (ZOFRAN ODT) 4 MG disintegrating tablet Take 1 tablet (4 mg total) by mouth every 8 (eight) hours as needed for nausea or vomiting. 01/13/18   Brynnleigh Mcelwee A, PA-C  ondansetron (ZOFRAN) 4 MG tablet Take 1 tablet (4 mg total) by mouth every 6 (six) hours as needed for nausea. 06/26/17   Rodolph Bong, MD  oxyCODONE-acetaminophen (PERCOCET) 10-325 MG tablet Take 1 tablet by mouth 4 (four) times daily. 12/15/17   [provider]  pantoprazole (PROTONIX) 40 MG tablet Take 1 tablet (40 mg total) by mouth daily. 06/26/17   Rodolph Bong, MD  potassium chloride (K-DUR) 10 MEQ tablet Take 1 tablet (10 mEq total) by mouth daily. 01/06/18 02/05/18  Mortis, Jerrel Ivory I, PA-C  pregabalin (LYRICA) 50 MG capsule Take 50 mg by mouth daily.    [provider]    Family History Family History  Problem Relation Age of Onset  .  Diabetes Mellitus II Father     Social History Social History   Tobacco Use  . Smoking status: Never Smoker  . Smokeless tobacco: Never Used  Substance Use Topics  . Alcohol use: No    Frequency: Never  . Drug use: No     Allergies   Other   Review of Systems Review of Systems  Constitutional: Negative for activity change, chills and fever.  HENT: Negative for congestion and sore throat.   Respiratory: Positive for cough and shortness of breath.   Cardiovascular: Positive for chest pain.  Gastrointestinal: Positive for nausea and vomiting. Negative for abdominal pain, constipation and diarrhea.    Genitourinary: Negative for dysuria.  Musculoskeletal: Negative for back pain, myalgias, neck pain and neck stiffness.  Skin: Negative for rash.  Allergic/Immunologic: Negative for immunocompromised state.  Neurological: Negative for dizziness, weakness, numbness and headaches.  Psychiatric/Behavioral: Negative for confusion.     Physical Exam Updated Vital Signs BP (!) 130/94 (BP Location: Right Wrist)   Pulse (!) 110   Temp 98.2 F (36.8 C) (Oral)   Resp 16   Ht 5\' 9"  (1.753 m)   Wt (!) 149.6 kg   SpO2 100%   BMI 48.70 kg/m   Physical Exam  Constitutional: No distress.  Obese female.   HENT:  Head: Normocephalic.  Eyes: Conjunctivae are normal.  Neck: Neck supple.  Cardiovascular: Normal rate and regular rhythm. Exam reveals no gallop and no friction rub.  No murmur heard. Pulmonary/Chest: Effort normal. No stridor. No respiratory distress. She has no wheezes. She has no rales. She exhibits tenderness.  Lungs are clear to auscultation bilaterally. NAD.  No nasal flaring, accessory muscle use, or retractions.  No tachypnea.  Chest expansion is equal and symmetric.  Reproducible tenderness to palpation to the anterior chest wall without crepitus or obvious deformities or step-offs.  Abdominal: Soft. She exhibits no distension.  Neurological: She is alert.  Skin: Skin is warm. No rash noted. She is not diaphoretic.  Psychiatric: She is agitated.  Nursing note and vitals reviewed.  ED Treatments / Results  Labs (all labs ordered are listed, but only abnormal results are displayed) Labs Reviewed  CBC - Abnormal; Notable for the following components:      Result Value   WBC 10.6 (*)    All other components within normal limits  BASIC METABOLIC PANEL - Abnormal; Notable for the following components:   Potassium 3.4 (*)    Glucose, Bld 102 (*)    All other components within normal limits  I-STAT TROPONIN, ED    EKG EKG Interpretation  Date/Time:  Wednesday  January 13 2018 14:31:24 EST Ventricular Rate:  105 PR Interval:  152 QRS Duration: 72 QT Interval:  320 QTC Calculation: 422 R Axis:   71 Text Interpretation:  Sinus tachycardia Nonspecific T wave abnormality Abnormal ECG No significant change was found Confirmed by Azalia Bilis ([id number]) on 01/14/2018 10:10:02 AM   Radiology Dg Chest 2 View  Result Date: 01/13/2018 CLINICAL DATA:  Chest pain, shortness of breath and nonproductive cough. Dyspnea. EXAM: CHEST - 2 VIEW COMPARISON:  Chest x-rays dated 01/06/2018 and 06/13/2017 and chest CT dated 06/13/2017 FINDINGS: The heart size and mediastinal contours are within normal limits. The smooth soft tissue nodule seen in the right lower lobe in the as ago esophageal recess adjacent to the inferior posterior aspect of the right hilum appears unchanged since 06/13/2017 the visualized skeletal structures are unremarkable. IMPRESSION: No acute abnormality.  Stable small nodule  in the right lower lobe. Electronically Signed   By: Francene Boyers M.D.   On: 01/13/2018 16:10    Procedures Procedures (including critical care time)  Medications Ordered in ED Medications  promethazine (PHENERGAN) injection 25 mg (25 mg Intravenous Given 01/13/18 1501)  ketorolac (TORADOL) 30 MG/ML injection 30 mg (30 mg Intravenous Given 01/13/18 1501)     Initial Impression / Assessment and Plan / ED Course  I have reviewed the triage vital signs and the nursing notes.  Pertinent labs & imaging results that were available during my care of the patient were reviewed by me and considered in my medical decision making (see chart for details).     42 year old female with a history of chronic pain of right knee and low back on chronic opioids, chronic pain syndrome, morbid obesity, OSA and GERD presenting with dyspnea on exertion and pleuritic chest pain, onset 1 week ago.   On arrival, patient is afebrile, RR 16, normotensive with HR 110, which resolves to 64 without  treatment.  She appears anxious, but is in no acute distress and has no increased work of breathing.  EKG with mild sinus tachycardia in the 100s with no other acute changes.  Troponin is negative.  HEART score is low risk.  Mild leukocytosis of 12.6, but labs are otherwise reassuring.  Delta troponin is not indicated at this time as the patient's chest pain has been constant for the last week.  When the patient was seen on 01/06/2018, chest x-ray was concerning for posterior segment right upper lobe pneumonia.  Repeat chest x-ray demonstrates a stable small nodule in the right lower lung, but is otherwise unremarkable.  Patient's history of present illness includes a nonproductive cough for the last week, pleuritic chest pain, and dyspnea on exertion.  No fever or chills.  I have a low suspicion for pneumonia or ACS at this time.  The patient also endorsed 2 episodes of nonbloody, nonbilious emesis prior to arrival.  Patient has been tolerating fluids without difficulty and has had no further episodes of emesis since arrival in the ED.  Patient's medical record is reviewed, she has had negative work-ups for similar symptoms in the past through various healthcare systems, most recently at [facility] in October and December 2018 as well as at [facility], ED in April 2019 where she had a negative PE study for similar pleuritic chest pain.  Although she was mildly tachycardic on arrival, I have a low suspicion for PE today. Well's Critieria 1.3%.   On initial patient interview, patient's significant other is requesting pain medication.  Toradol is offered, but significant other speaks for the patient and states that Toradol does not help her with her pain, but the patient is initially agreeable.  Later notified by nursing staff that the patient's pain has not improved and Tylenol was offered.  The patient was discussed with Dr. Jeraldine Loots, attending physician who is in agreement with the work-up and plan.  Low  suspicion for pericarditis, myocarditis, esophageal rupture, aortic dissection at this time.  [place] controlled substance database was reviewed.  The patient last had 90 tablets of oxycodone acetaminophen 10-325 filled on 12/15/2017. Overdose risk score is 620 and the patient has had 36 prescriptions from 16 prescribers filled at 13 different pharmacies in the last 2 years.   After patient's work-up was complete, all imaging and lab results were discussed with the patient and her significant other.  All questions were answered.  The patient states significant  other is stating that the patient needs IV antibiotics for her pneumonia.  I discussed that chest x-ray was clear and I have a low suspicion for pneumonia at this time.  Patient significant other is also stating that the standard of care for chest pain is a repeat troponin.  I have discussed with the patient and her significant other that given the length of time that her chest pain has been constant that a repeat troponin is not indicated at this time and initial troponin was negative.  The patient's significant other is again requesting narcotics for pain control for the patient.  Discussed that narcotics are not clinically indicated at this time and advised the patient to take her home medication for pain control.   I have recommended the patient follow up with primary care and pain management if symptoms persist.  The patient was given strict return precautions to the emergency department for new or worsening symptoms.  She remains hemodynamically stable with normal vital signs and in no acute distress.  She is safe for discharge to home with outpatient follow-up at this time.  Final Clinical Impressions(s) / ED Diagnoses   Final diagnoses:  Atypical chest pain    ED Discharge Orders         Ordered    ondansetron (ZOFRAN ODT) 4 MG disintegrating tablet  Every 8 hours PRN     01/13/18 1619           Jessicaann Overbaugh A,  PA-C 01/14/18 1221    Gerhard Munch, MD 01/14/18 1746

## 2018-05-13 ENCOUNTER — Encounter (HOSPITAL_COMMUNITY): Payer: Self-pay

## 2018-05-13 ENCOUNTER — Other Ambulatory Visit: Payer: Self-pay

## 2018-05-13 ENCOUNTER — Emergency Department (HOSPITAL_COMMUNITY)
Admission: EM | Admit: 2018-05-13 | Discharge: 2018-05-13 | Disposition: A | Discharge status: Home / Self Care | Payer: Medicaid Other | Attending: Emergency Medicine | Admitting: Emergency Medicine

## 2018-05-13 DIAGNOSIS — Z5329 Procedure and treatment not carried out because of patient's decision for other reasons: Secondary | ICD-10-CM | POA: Insufficient documentation

## 2018-05-13 DIAGNOSIS — Z79899 Other long term (current) drug therapy: Secondary | ICD-10-CM | POA: Insufficient documentation

## 2018-05-13 DIAGNOSIS — R519 Headache, unspecified: Secondary | ICD-10-CM

## 2018-05-13 DIAGNOSIS — R51 Headache: Secondary | ICD-10-CM | POA: Insufficient documentation

## 2018-05-13 LAB — COMPREHENSIVE METABOLIC PANEL
ALT: 38 U/L (ref 0–44)
ANION GAP: 9 (ref 5–15)
AST: 38 U/L (ref 15–41)
Albumin: 3.9 g/dL (ref 3.5–5.0)
Alkaline Phosphatase: 127 U/L — ABNORMAL HIGH (ref 38–126)
BUN: 11 mg/dL (ref 6–20)
CHLORIDE: 102 mmol/L (ref 98–111)
CO2: 27 mmol/L (ref 22–32)
Calcium: 9.2 mg/dL (ref 8.9–10.3)
Creatinine, Ser: 1.06 mg/dL — ABNORMAL HIGH (ref 0.44–1.00)
Glucose, Bld: 90 mg/dL (ref 70–99)
POTASSIUM: 4 mmol/L (ref 3.5–5.1)
Sodium: 138 mmol/L (ref 135–145)
Total Bilirubin: 0.5 mg/dL (ref 0.3–1.2)
Total Protein: 7.5 g/dL (ref 6.5–8.1)

## 2018-05-13 LAB — CBC WITH DIFFERENTIAL/PLATELET
Abs Immature Granulocytes: 0.04 10*3/uL (ref 0.00–0.07)
BASOS PCT: 0 %
Basophils Absolute: 0 10*3/uL (ref 0.0–0.1)
EOS ABS: 0.2 10*3/uL (ref 0.0–0.5)
EOS PCT: 1 %
HCT: 20.3 % — ABNORMAL LOW (ref 36.0–46.0)
Hemoglobin: 6.3 g/dL — CL (ref 12.0–15.0)
IMMATURE GRANULOCYTES: 0 %
Lymphocytes Relative: 29 %
Lymphs Abs: 3.6 10*3/uL (ref 0.7–4.0)
MCH: 29.9 pg (ref 26.0–34.0)
MCHC: 31 g/dL (ref 30.0–36.0)
MCV: 96.2 fL (ref 80.0–100.0)
MONOS PCT: 5 %
Monocytes Absolute: 0.6 10*3/uL (ref 0.1–1.0)
NEUTROS PCT: 65 %
Neutro Abs: 8 10*3/uL — ABNORMAL HIGH (ref 1.7–7.7)
PLATELETS: 376 10*3/uL (ref 150–400)
RBC: 2.11 MIL/uL — ABNORMAL LOW (ref 3.87–5.11)
RDW: 13.2 % (ref 11.5–15.5)
WBC: 12.4 10*3/uL — ABNORMAL HIGH (ref 4.0–10.5)
nRBC: 0 % (ref 0.0–0.2)

## 2018-05-13 LAB — LIPASE, BLOOD: LIPASE: 22 U/L (ref 11–51)

## 2018-05-13 LAB — URINALYSIS, ROUTINE W REFLEX MICROSCOPIC
Bacteria, UA: NONE SEEN
Bilirubin Urine: NEGATIVE
Glucose, UA: NEGATIVE mg/dL
Ketones, ur: NEGATIVE mg/dL
Nitrite: NEGATIVE
Protein, ur: NEGATIVE mg/dL
Specific Gravity, Urine: 1.023 (ref 1.005–1.030)
pH: 6 (ref 5.0–8.0)

## 2018-05-13 LAB — HEMOGLOBIN AND HEMATOCRIT, BLOOD
HCT: 33.1 % — ABNORMAL LOW (ref 36.0–46.0)
Hemoglobin: 10.6 g/dL — ABNORMAL LOW (ref 12.0–15.0)

## 2018-05-13 LAB — POC URINE PREG, ED: Preg Test, Ur: NEGATIVE

## 2018-05-13 MED ORDER — KETOROLAC TROMETHAMINE 30 MG/ML IJ SOLN
30.0000 mg | Freq: Once | INTRAMUSCULAR | Status: AC
  Administered 2018-05-13: 30 mg via INTRAVENOUS
  Filled 2018-05-13: qty 1

## 2018-05-13 MED ORDER — PROCHLORPERAZINE EDISYLATE 10 MG/2ML IJ SOLN
10.0000 mg | Freq: Once | INTRAMUSCULAR | Status: AC
  Administered 2018-05-13: 10 mg via INTRAVENOUS
  Filled 2018-05-13: qty 2

## 2018-05-13 MED ORDER — MAGNESIUM SULFATE IN D5W 1-5 GM/100ML-% IV SOLN
1.0000 g | Freq: Once | INTRAVENOUS | Status: DC
  Filled 2018-05-13: qty 100

## 2018-05-13 MED ORDER — DIPHENHYDRAMINE HCL 50 MG/ML IJ SOLN
25.0000 mg | Freq: Once | INTRAMUSCULAR | Status: AC
  Administered 2018-05-13: 25 mg via INTRAVENOUS
  Filled 2018-05-13: qty 1

## 2018-05-13 MED ORDER — HALOPERIDOL LACTATE 5 MG/ML IJ SOLN
10.0000 mg | Freq: Once | INTRAMUSCULAR | Status: DC
  Filled 2018-05-13: qty 2

## 2018-05-13 MED ORDER — DEXAMETHASONE SODIUM PHOSPHATE 10 MG/ML IJ SOLN
10.0000 mg | Freq: Once | INTRAMUSCULAR | Status: AC
  Administered 2018-05-13: 10 mg via INTRAVENOUS
  Filled 2018-05-13: qty 1

## 2018-05-13 NOTE — ED Provider Notes (Signed)
[facility] EMERGENCY DEPARTMENT Provider Note   CSN: [id number] Arrival date & time: 05/13/18  1059    History   Chief Complaint Chief Complaint  Patient presents with  . Nausea  . Emesis    HPI Emma Lowe is a 43 y.o. female w/ h/o anemia, chronic headaches, obesity, GERD, gastritis, here for evaluation of headache.  Reports chronic headaches for over 20 years.  She has had a headache every day for the last 31 days.  Describes it as a 9/10, right sided, localized, throbbing.  In the last several years she has tried OTC medicines without relief.  At one point she was prescribed migraine medicine but stopped it because it wasn't helping. No alleviating factors. No aggravating factors.  Not positional or with straining.  Atraumatic. Has seen PCP for headaches and they ordered a head CT but Medicaid declined it.  She has an appointment with neurology soon for the first time. Associated symptoms include nausea, vomiting since Monday. Vomiting is nbnb, up to 5-6x a day.  Has not been able to keep fluids down due to nausea and vomiting in the last day.  Tried OTC nausea medicine w/o relief.  Reports intermittent double vision for years with headache, but none currently. No vision loss.  No trauma or recent falls.  No fevers, neck stiffness, vision loss, CP, SOB, diarrhea, constipation, melena, hematochezia, abdominal pain.  Last BM yesterday, passing gas normally.  H/o hysterectomy, cholecystectomy, endometriosis, ectopic pregnancy.  Had colonoscopy and EGD by GI 2016 to r/o Chron's disease, was told she had gastritis but no PUD.  Denies ETOH or marijuana use. She does take OTC NSAIDs for headache frequently.     HPI  Past Medical History:  Diagnosis Date  . GERD (gastroesophageal reflux disease)   . 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 Sleep apnea     Patient Active Problem List   Diagnosis Date Noted  . Hypokalemia 06/24/2017  . Dental abscess  06/23/2017  . Facial cellulitis 06/23/2017  . Double vision 06/23/2017  . GERD (gastroesophageal reflux disease)     Past Surgical History:  Procedure Laterality Date  . ABDOMINAL HYSTERECTOMY    . CESAREAN SECTION    . CHOLECYSTECTOMY       OB History   No obstetric history on file.      Home Medications    Prior to Admission medications   Medication Sig Start Date End Date Taking? Authorizing Provider  ondansetron (ZOFRAN ODT) 4 MG disintegrating tablet Take 1 tablet (4 mg total) by mouth every 8 (eight) hours as needed for nausea or vomiting. 01/13/18   McDonald, Mia A, PA-C  ondansetron (ZOFRAN) 4 MG tablet Take 1 tablet (4 mg total) by mouth every 6 (six) hours as needed for nausea. 06/26/17   Rodolph Bong, MD  oxyCODONE-acetaminophen (PERCOCET) 10-325 MG tablet Take 1 tablet by mouth 4 (four) times daily. 12/15/17   [provider]  pantoprazole (PROTONIX) 40 MG tablet Take 1 tablet (40 mg total) by mouth daily. 06/26/17   Rodolph Bong, MD  potassium chloride (K-DUR) 10 MEQ tablet Take 1 tablet (10 mEq total) by mouth daily. 01/06/18 02/05/18  Mortis, Jerrel Ivory I, PA-C  pregabalin (LYRICA) 50 MG capsule Take 50 mg by mouth daily.    [provider]    Family History Family History  Problem Relation Age of Onset  . Diabetes Mellitus II Father     Social History Social  History   Tobacco Use  . Smoking status: Never Smoker  . Smokeless tobacco: Never Used  Substance Use Topics  . Alcohol use: No    Frequency: Never  . Drug use: No     Allergies   Other   Review of Systems Review of Systems  Eyes: Positive for visual disturbance (intermittent diplopia, none currently).  Gastrointestinal: Positive for nausea and vomiting.  Neurological: Positive for headaches.  All other systems reviewed and are negative.    Physical Exam Updated Vital Signs BP (!) 163/99 (BP Location: Right Arm)   Pulse 77   Temp 98.2 F (36.8 C) (Oral)    Resp 18   Ht  (1.753 m)   Wt (!) 145.2 kg   SpO2 100%   BMI 47.26 kg/m   Physical Exam Constitutional:      Appearance: She is well-developed. She is not toxic-appearing.     Comments: Lights in room off. Pt looks uncomfortable, keeps eyes closed.   HENT:     Head: Normocephalic and atraumatic.     Right Ear: External ear normal.     Left Ear: External ear normal.     Nose: Nose normal. No septal deviation or mucosal edema.     Mouth/Throat:     Comments: Poor dentition.  Eyes:     General: Lids are normal.     Conjunctiva/sclera: Conjunctivae normal.     Comments: Unable to visualize back of eye  Neck:     Comments: No c spine spinous process or muscular tenderness  Full PROM of neck w/o rigidity  No meningeal signs  Cardiovascular:     Rate and Rhythm: Normal rate and regular rhythm.     Pulses:          Radial pulses are 2+ on the right side and 2+ on the left side.       Dorsalis pedis pulses are 2+ on the right side and 2+ on the left side.     Heart sounds: Normal heart sounds.  Pulmonary:     Effort: Pulmonary effort is normal.     Breath sounds: Normal breath sounds.  Lymphadenopathy:     Comments: No cervical adenopathy  Skin:    General: Skin is warm and dry.     Capillary Refill: Capillary refill takes less than 2 seconds.     Findings: No rash.  Neurological:     Mental Status: She is alert.     GCS: GCS eye subscore is 4. GCS verbal subscore is 5. GCS motor subscore is 6.     Comments:  Alert and oriented to self, place, time and event.  Speech is fluent without dysarthria or dysphasia. Strength 5/5 with hand grip and ankle F/E.   Sensation to light touch intact in face, hands and feet. Normal gait No pronator drift. No leg drop. Normal finger-to-nose and feet tapping.  CN I not tested CN II grossly intact visual fields bilaterally. Unable to visualize posterior eye. CN III, IV, VI PEERL and EOMs intact bilaterally CN V light touch intact in  all 3 divisions of trigeminal nerve CN VII facial movements symmetric CN VIII not tested CN IX, X no uvula deviation, symmetric rise of soft palate  CN XI 5/5 SCM and trapezius strength bilaterally  CN XII Midline tongue protrusion, symmetric L/R movements  Psychiatric:        Speech: Speech normal.        Behavior: Behavior normal.  Thought Content: Thought content normal.        Judgment: Judgment normal.      ED Treatments / Results  Labs (all labs ordered are listed, but only abnormal results are displayed) Labs Reviewed  CBC WITH DIFFERENTIAL/PLATELET - Abnormal; Notable for the following components:      Result Value   WBC 12.4 (*)    RBC 2.11 (*)    Hemoglobin 6.3 (*)    HCT 20.3 (*)    Neutro Abs 8.0 (*)    All other components within normal limits  COMPREHENSIVE METABOLIC PANEL - Abnormal; Notable for the following components:   Creatinine, Ser 1.06 (*)    Alkaline Phosphatase 127 (*)    All other components within normal limits  URINALYSIS, ROUTINE W REFLEX MICROSCOPIC - Abnormal; Notable for the following components:   APPearance HAZY (*)    Hgb urine dipstick SMALL (*)    Leukocytes,Ua SMALL (*)    All other components within normal limits  HEMOGLOBIN AND HEMATOCRIT, BLOOD - Abnormal; Notable for the following components:   Hemoglobin 10.6 (*)    HCT 33.1 (*)    All other components within normal limits  LIPASE, BLOOD  POC URINE PREG, ED    EKG None  Radiology No results found.  Procedures Procedures (including critical care time)  Medications Ordered in ED Medications  magnesium sulfate IVPB 1 g 100 mL (has no administration in time range)  haloperidol lactate (HALDOL) injection 10 mg (has no administration in time range)  prochlorperazine (COMPAZINE) injection 10 mg (10 mg Intravenous Given 05/13/18 1318)  diphenhydrAMINE (BENADRYL) injection 25 mg (25 mg Intravenous Given 05/13/18 1319)  ketorolac (TORADOL) 30 MG/ML injection 30 mg (30 mg  Intravenous Given 05/13/18 1333)  dexamethasone (DECADRON) injection 10 mg (10 mg Intravenous Given 05/13/18 1333)     Initial Impression / Assessment and Plan / ED Course  I have reviewed the triage vital signs and the nursing notes.  Pertinent labs & imaging results that were available during my care of the patient were reviewed by me and considered in my medical decision making (see chart for details).  Clinical Course as of May 13 1434  Thu May 13, 2018  1415 Creatinine(!): 1.06 [JM]    Clinical Course User Index [JM] [place]       Highest suspicion for tension type headache vs migraine related n/v. HA has been chronic, unchanged, with intermittent diplopia but none currently.  Otherwise, pt without high-risk features of headache including: sudden/thunderclap HA, , AMS, seizure, fever, meningeal signs, worse with exertion or valsalva, age > 22, history of immunocompromise, use of anticoagulation, h/o aneurysms or AVMs, focal neuro deficits, elevated BP, trauma. On exam VS are WNL, pt is well-appearing w/ no meningismus, nystagmus, focal neuro deficits, pain over temporal arteries.  Given symptomatology and exam, doubt/dxx infectious source like meningitis, sinusitis, intracranial bleed, space occupying lesions, CVA, dissection.  She takes NSAIDs frequently and considering analgesia rebound HA.  Elevated BMI and IIH is a possibility.    1345: Repeat H/H shows baseline anemia, labs otherwise unremarkable.  Pt given toradol, decadron, compazine, benadryl w//o improvement in HA.  Ordered haldol and mg, pending head CT.   1435: RN notified me pt wanting to be discharged.  Discussed risk of leaving without head CT although low suspicion for intracranial emergency today.  I offered pt repeat migraine cocktail but she declined and request dc. Encouraged her f/u with neuro as scheduled. Return precautions given.  Final Clinical Impressions(s) / ED Diagnoses   Final  diagnoses:  Right-sided headache    ED Discharge Orders    None       Liberty Handy, PA-C 05/13/18 1436    Terrilee Files, MD 05/13/18 1740

## 2018-05-13 NOTE — Discharge Instructions (Addendum)
You declined further treatment, head CT, medications in ER and requested discharge.  Return for any worsening or new symptoms  Ultimately you need follow up with neurology for further management of your chronic headaches.  Your hemoglobin was 6 today,  you did not want to wait for re-check of this.  I will call you if this is critically low on repeat test

## 2018-05-13 NOTE — ED Triage Notes (Signed)
Pt here to [facility] ED via POV with recurrent N/V since Monday. States this recurs every few months. Denies diarrhea or fevers, does not see GI doctor regularly but does stte she has had GI workups including colonoscopy, crohns was ruled out as well.

## 2018-05-13 NOTE — ED Notes (Signed)
Pt found in room getting dressed, off monitors.  States she has to leave due to sick child.  Shortly after lab called with critical value hgb 6.3 Explained signifacnce of this find and importance of staying for medical evaluation and treatment, and risks involved if she left AMA. Patient agrees to stay to talk with provider on plan of care. PA paged to room.

## 2018-05-13 NOTE — ED Notes (Signed)
Patient left AMA, refused vitals, signature, or wheelchair.

## 2018-05-13 NOTE — Progress Notes (Signed)
MD was able to obtain PIV via ultrasound

## 2018-07-10 ENCOUNTER — Emergency Department (HOSPITAL_COMMUNITY): Payer: Medicaid Other

## 2018-07-10 ENCOUNTER — Emergency Department (HOSPITAL_COMMUNITY)
Admission: EM | Admit: 2018-07-10 | Discharge: 2018-07-10 | Disposition: A | Discharge status: Home / Self Care | Payer: Medicaid Other | Attending: Emergency Medicine | Admitting: Emergency Medicine

## 2018-07-10 ENCOUNTER — Encounter (HOSPITAL_COMMUNITY): Payer: Self-pay

## 2018-07-10 ENCOUNTER — Other Ambulatory Visit: Payer: Self-pay

## 2018-07-10 DIAGNOSIS — R911 Solitary pulmonary nodule: Secondary | ICD-10-CM | POA: Diagnosis not present

## 2018-07-10 DIAGNOSIS — R1013 Epigastric pain: Secondary | ICD-10-CM

## 2018-07-10 DIAGNOSIS — Z79899 Other long term (current) drug therapy: Secondary | ICD-10-CM | POA: Diagnosis not present

## 2018-07-10 DIAGNOSIS — R112 Nausea with vomiting, unspecified: Secondary | ICD-10-CM

## 2018-07-10 LAB — CBC WITH DIFFERENTIAL/PLATELET
Abs Immature Granulocytes: 0.01 10*3/uL (ref 0.00–0.07)
Basophils Absolute: 0 10*3/uL (ref 0.0–0.1)
Basophils Relative: 0 %
Eosinophils Absolute: 0.1 10*3/uL (ref 0.0–0.5)
Eosinophils Relative: 1 %
HCT: 38.7 % (ref 36.0–46.0)
Hemoglobin: 12.4 g/dL (ref 12.0–15.0)
Immature Granulocytes: 0 %
Lymphocytes Relative: 24 %
Lymphs Abs: 2 10*3/uL (ref 0.7–4.0)
MCH: 30.8 pg (ref 26.0–34.0)
MCHC: 32 g/dL (ref 30.0–36.0)
MCV: 96.3 fL (ref 80.0–100.0)
Monocytes Absolute: 0.4 10*3/uL (ref 0.1–1.0)
Monocytes Relative: 5 %
Neutro Abs: 5.6 10*3/uL (ref 1.7–7.7)
Neutrophils Relative %: 70 %
Platelets: 333 10*3/uL (ref 150–400)
RBC: 4.02 MIL/uL (ref 3.87–5.11)
RDW: 13.2 % (ref 11.5–15.5)
WBC: 8.1 10*3/uL (ref 4.0–10.5)
nRBC: 0 % (ref 0.0–0.2)

## 2018-07-10 LAB — LIPASE, BLOOD: Lipase: 26 U/L (ref 11–51)

## 2018-07-10 LAB — COMPREHENSIVE METABOLIC PANEL
ALT: 14 U/L (ref 0–44)
AST: 23 U/L (ref 15–41)
Albumin: 4.1 g/dL (ref 3.5–5.0)
Alkaline Phosphatase: 160 U/L — ABNORMAL HIGH (ref 38–126)
Anion gap: 11 (ref 5–15)
BUN: 8 mg/dL (ref 6–20)
CO2: 27 mmol/L (ref 22–32)
Calcium: 9.7 mg/dL (ref 8.9–10.3)
Chloride: 100 mmol/L (ref 98–111)
Creatinine, Ser: 1.13 mg/dL — ABNORMAL HIGH (ref 0.44–1.00)
GFR calc Af Amer: >60 mL/min (ref >60)
GFR calc non Af Amer: 60 mL/min — ABNORMAL LOW (ref >60)
Glucose, Bld: 112 mg/dL — ABNORMAL HIGH (ref 70–99)
Potassium: 3.9 mmol/L (ref 3.5–5.1)
Sodium: 138 mmol/L (ref 135–145)
Total Bilirubin: 0.5 mg/dL (ref 0.3–1.2)
Total Protein: 8.3 g/dL — ABNORMAL HIGH (ref 6.5–8.1)

## 2018-07-10 LAB — I-STAT BETA HCG BLOOD, ED (MC, WL, AP ONLY): I-stat hCG, quantitative: <5 m[IU]/mL (ref <5)

## 2018-07-10 MED ORDER — SODIUM CHLORIDE 0.9 % IV BOLUS
2000.0000 mL | Freq: Once | INTRAVENOUS | Status: AC
  Administered 2018-07-10: 2000 mL via INTRAVENOUS

## 2018-07-10 MED ORDER — ALUM & MAG HYDROXIDE-SIMETH 200-200-20 MG/5ML PO SUSP
30.0000 mL | Freq: Once | ORAL | Status: DC
  Filled 2018-07-10: qty 30

## 2018-07-10 MED ORDER — PROMETHAZINE HCL 25 MG PO TABS: 25.0000 mg | ORAL_TABLET | Freq: Four times a day (QID) | ORAL | 0 refills | Status: AC | PRN

## 2018-07-10 MED ORDER — ONDANSETRON 4 MG PO TBDP: 4.0000 mg | ORAL_TABLET | Freq: Three times a day (TID) | ORAL | 0 refills | Status: AC | PRN

## 2018-07-10 MED ORDER — DICYCLOMINE HCL 20 MG PO TABS: 20.0000 mg | ORAL_TABLET | Freq: Three times a day (TID) | ORAL | 0 refills | Status: AC

## 2018-07-10 MED ORDER — LIDOCAINE VISCOUS HCL 2 % MT SOLN
15.0000 mL | Freq: Once | OROMUCOSAL | Status: DC
  Filled 2018-07-10: qty 15

## 2018-07-10 MED ORDER — METOCLOPRAMIDE HCL 5 MG/ML IJ SOLN
10.0000 mg | Freq: Once | INTRAMUSCULAR | Status: AC
  Administered 2018-07-10: 13:00:00 10 mg via INTRAVENOUS
  Filled 2018-07-10: qty 2

## 2018-07-10 MED ORDER — ALUM & MAG HYDROXIDE-SIMETH 200-200-20 MG/5ML PO SUSP
30.0000 mL | Freq: Once | ORAL | Status: AC
  Administered 2018-07-10: 30 mL via ORAL
  Filled 2018-07-10: qty 30

## 2018-07-10 MED ORDER — DIPHENHYDRAMINE HCL 50 MG/ML IJ SOLN
25.0000 mg | Freq: Once | INTRAMUSCULAR | Status: AC
  Administered 2018-07-10: 25 mg via INTRAVENOUS
  Filled 2018-07-10: qty 1

## 2018-07-10 MED ORDER — MORPHINE SULFATE (PF) 4 MG/ML IV SOLN
4.0000 mg | Freq: Once | INTRAVENOUS | Status: AC
  Administered 2018-07-10: 13:00:00 4 mg via INTRAVENOUS
  Filled 2018-07-10: qty 1

## 2018-07-10 MED ORDER — LIDOCAINE VISCOUS HCL 2 % MT SOLN
15.0000 mL | Freq: Once | OROMUCOSAL | Status: AC
  Administered 2018-07-10: 14:00:00 15 mL via ORAL
  Filled 2018-07-10: qty 15

## 2018-07-10 NOTE — Discharge Instructions (Addendum)
Your symptoms could be from gastritis or an ulcer. You will need to avoid spicy/fatty/acidic foods, avoid soda/coffee/tea/alcohol. Avoid laying down flat within 30 minutes of eating. Avoid NSAIDs like ibuprofen/aleve/motrin/etc on an empty stomach. May consider using over the counter tums/maalox/pepcid/zantac/etc as needed for additional relief. Use prescribed phenergan and/or zofran as directed as needed for nausea. Use tylenol as needed for pain. Take prescribed bentyl as directed as needed for GI discomfort. Follow up with your regular doctor in 5-7 days for recheck of symptoms. Return to the ER for changes or worsening symptoms.  Also, your CT showed a stable lung nodule that's been there since your last CT scan. This will just need to be monitored by your regular doctor.

## 2018-07-10 NOTE — ED Provider Notes (Signed)
[facility] EMERGENCY DEPARTMENT Provider Note   CSN: [id number] Arrival date & time: 07/10/18  1035    History   Chief Complaint Chief Complaint  Patient presents with  . Emesis    HPI Emma Lowe is a 43 y.o. female.     HPI   43 yo F with h/o GERD, recurrent n/v here with abdominal pain, nausea, vomiting. Pt reports that for the past week, she's had progressively worsening nausea, vomiting, and epigastric w/ RUQ pain. Pt states her sx began gradually on Saturday and have persistently worsened since then. She's been unable to keep any food or water down. No fevers. She's had some associated aching, throbbing, epigastric pain worse w/ eating. No alleviating factors. She is s/p cholecystectomy. No new foods, no travel. No marijuana use.  Past Medical History:  Diagnosis Date  . GERD (gastroesophageal reflux disease)   . 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 Sleep apnea     Patient Active Problem List   Diagnosis Date Noted  . Hypokalemia 06/24/2017  . Dental abscess 06/23/2017  . Facial cellulitis 06/23/2017  . Double vision 06/23/2017  . GERD (gastroesophageal reflux disease)     Past Surgical History:  Procedure Laterality Date  . ABDOMINAL HYSTERECTOMY    . CESAREAN SECTION    . CHOLECYSTECTOMY       OB History   No obstetric history on file.      Home Medications    Prior to Admission medications   Medication Sig Start Date End Date Taking? Authorizing Provider  dicyclomine (BENTYL) 20 MG tablet Take 1 tablet (20 mg total) by mouth 4 (four) times daily -  before meals and at bedtime for 5 days. 07/10/18 07/15/18  Shaune Pollack, MD  dicyclomine (BENTYL) 20 MG tablet Take 1 tablet (20 mg total) by mouth 4 (four) times daily -  before meals and at bedtime for 5 days. 07/10/18 07/15/18  Street, Mercedes, PA-C  ondansetron (ZOFRAN ODT) 4 MG disintegrating tablet Take 1 tablet (4 mg total) by mouth every 8 (eight) hours as  needed for nausea or vomiting. 01/13/18   McDonald, Mia A, PA-C  ondansetron (ZOFRAN ODT) 4 MG disintegrating tablet Take 1 tablet (4 mg total) by mouth every 8 (eight) hours as needed for nausea or vomiting. 07/10/18   Street, Mercedes, PA-C  ondansetron (ZOFRAN) 4 MG tablet Take 1 tablet (4 mg total) by mouth every 6 (six) hours as needed for nausea. 06/26/17   Rodolph Bong, MD  oxyCODONE-acetaminophen (PERCOCET) 10-325 MG tablet Take 1 tablet by mouth 4 (four) times daily. 12/15/17   [provider]  pantoprazole (PROTONIX) 40 MG tablet Take 1 tablet (40 mg total) by mouth daily. 06/26/17   Rodolph Bong, MD  potassium chloride (K-DUR) 10 MEQ tablet Take 1 tablet (10 mEq total) by mouth daily. 01/06/18 02/05/18  Mortis, Jerrel Ivory I, PA-C  pregabalin (LYRICA) 50 MG capsule Take 50 mg by mouth daily.    [provider]  promethazine (PHENERGAN) 25 MG tablet Take 1 tablet (25 mg total) by mouth every 6 (six) hours as needed for nausea or vomiting. 07/10/18   Shaune Pollack, MD  promethazine (PHENERGAN) 25 MG tablet Take 1 tablet (25 mg total) by mouth every 6 (six) hours as needed for nausea or vomiting. 07/10/18   Street, Ali Chuk, PA-C    Family History Family History  Problem Relation Age of Onset  . Diabetes Mellitus II Father  Social History Social History   Tobacco Use  . Smoking status: Never Smoker  . Smokeless tobacco: Never Used  Substance Use Topics  . Alcohol use: No    Frequency: Never  . Drug use: No     Allergies   Other   Review of Systems Review of Systems  Constitutional: Positive for fatigue. Negative for chills and fever.  HENT: Negative for congestion and rhinorrhea.   Eyes: Negative for visual disturbance.  Respiratory: Negative for cough, shortness of breath and wheezing.   Cardiovascular: Negative for chest pain and leg swelling.  Gastrointestinal: Positive for abdominal pain, nausea and vomiting. Negative for diarrhea.   Genitourinary: Negative for dysuria and flank pain.  Musculoskeletal: Negative for neck pain and neck stiffness.  Skin: Negative for rash and wound.  Allergic/Immunologic: Negative for immunocompromised state.  Neurological: Negative for syncope, weakness and headaches.  All other systems reviewed and are negative.    Physical Exam Updated Vital Signs BP 128/87   Pulse 70   Temp 98.6 F (37 C) (Oral)   Resp 17   Ht 5\' 9"  (1.753 m)   Wt (!) 149.7 kg   SpO2 99%   BMI 48.73 kg/m   Physical Exam Vitals signs and nursing note reviewed.  Constitutional:      General: She is not in acute distress.    Appearance: She is well-developed.  HENT:     Head: Normocephalic and atraumatic.  Eyes:     Conjunctiva/sclera: Conjunctivae normal.  Neck:     Musculoskeletal: Neck supple.  Cardiovascular:     Rate and Rhythm: Normal rate and regular rhythm.     Heart sounds: Normal heart sounds. No murmur. No friction rub.  Pulmonary:     Effort: Pulmonary effort is normal. No respiratory distress.     Breath sounds: Normal breath sounds. No wheezing or rales.  Abdominal:     General: Bowel sounds are normal. There is no distension.     Palpations: Abdomen is soft.     Tenderness: There is abdominal tenderness in the right upper quadrant and epigastric area. There is no guarding or rebound.  Skin:    General: Skin is warm.     Capillary Refill: Capillary refill takes less than 2 seconds.  Neurological:     Mental Status: She is alert and oriented to person, place, and time.     Motor: No abnormal muscle tone.      ED Treatments / Results  Labs (all labs ordered are listed, but only abnormal results are displayed) Labs Reviewed  COMPREHENSIVE METABOLIC PANEL - Abnormal; Notable for the following components:      Result Value   Glucose, Bld 112 (*)    Creatinine, Ser 1.13 (*)    Total Protein 8.3 (*)    Alkaline Phosphatase 160 (*)    GFR calc non Af Amer 60 (*)    All other  components within normal limits  CBC WITH DIFFERENTIAL/PLATELET  LIPASE, BLOOD  I-STAT BETA HCG BLOOD, ED ([facility], [facility], [facility] ONLY)    EKG EKG Interpretation  Date/Time:  Saturday Jul 10 2018 10:45:03 EDT Ventricular Rate:  82 PR Interval:    QRS Duration: 89 QT Interval:  376 QTC Calculation: 440 R Axis:   45 Text Interpretation:  Sinus rhythm No significant change since last tracing Confirmed by Shaune Pollack [phone]) on 07/10/2018 10:49:52 AM   Radiology Ct Abdomen Pelvis Wo Contrast  Result Date: 07/10/2018 CLINICAL DATA:  43 year old with abdominal distension. Abdominal  pain. EXAM: CT ABDOMEN AND PELVIS WITHOUT CONTRAST TECHNIQUE: Multidetector CT imaging of the abdomen and pelvis was performed following the standard protocol without IV contrast. COMPARISON:  03/02/2017 and chest CT 06/13/2017 FINDINGS: Lower chest: Stable 1.3 cm round nodule in the medial right lower lobe. No pleural effusions.Few hazy densities in the lingular region are nonspecific but may represent atelectasis. Hepatobiliary: Cholecystectomy.  No acute abnormality to the liver. Pancreas: Unremarkable. No pancreatic ductal dilatation or surrounding inflammatory changes. Mild fatty infiltration involving the pancreatic head and neck region. Spleen: Normal in size without focal abnormality. Adrenals/Urinary Tract: Normal adrenal glands. Urinary bladder is unremarkable. Normal appearance of the kidneys without stones or hydronephrosis. Stomach/Bowel: Stomach is within normal limits. Appendix appears normal. No evidence of bowel wall thickening, distention, or inflammatory changes. Vascular/Lymphatic: No significant vascular findings are present. No enlarged abdominal or pelvic lymph nodes. Reproductive: Status post hysterectomy. No adnexal masses. Other: No ascites. Negative for free air. Small umbilical hernia containing fat. Musculoskeletal: No acute bone abnormality. IMPRESSION: 1. No acute findings in the abdomen or pelvis. 2.  Stable 1.3 cm pulmonary nodule in the right lower lobe. This nodule is stable since the chest CT from 06/13/2017. Recommend follow-up to ensure at least 2 years of stability. Electronically Signed   By: Richarda OverlieAdam  Henn M.D.   On: 07/10/2018 13:44    Procedures Ultrasound ED Peripheral IV (Provider) Date/Time: 07/10/2018 12:41 PM Performed by: Shaune PollackIsaacs, Fabion Gatson, MD Authorized by: Shaune PollackIsaacs, Emmilyn Crooke, MD   Procedure details:    Indications: multiple failed IV attempts     Skin Prep: chlorhexidine gluconate     Location: Right EJ per pt request.   Angiocath:  20 G   Bedside Ultrasound Guided: Yes     Images: archived     Patient tolerated procedure without complications: Yes     Dressing applied: Yes     (including critical care time)      Medications Ordered in ED Medications  metoCLOPramide (REGLAN) injection 10 mg (10 mg Intravenous Given 07/10/18 1233)  diphenhydrAMINE (BENADRYL) injection 25 mg (25 mg Intravenous Given 07/10/18 1233)  sodium chloride 0.9 % bolus 2,000 mL (2,000 mLs Intravenous Bolus from Bag 07/10/18 1233)  morphine 4 MG/ML injection 4 mg (4 mg Intravenous Given 07/10/18 1306)  alum & mag hydroxide-simeth (MAALOX/MYLANTA) 200-200-20 MG/5ML suspension 30 mL (30 mLs Oral Given 07/10/18 1352)    And  lidocaine (XYLOCAINE) 2 % viscous mouth solution 15 mL (15 mLs Oral Given 07/10/18 1352)     Initial Impression / Assessment and Plan / ED Course  I have reviewed the triage vital signs and the nursing notes.  Pertinent labs & imaging results that were available during my care of the patient were reviewed by me and considered in my medical decision making (see chart for details).  Clinical Course as of Jul 09 1424  Sat Jul 10, 2018  1126 43 yo F with PMHx as above here w/ nausea, vomiting. Suspect gastritis, duodenitis, viral GI syndrome. No fevers. Abdomen soft. Labs, imaging pending. S/p cholecystectomy.   [CI]  1252 EJ placed by myself per request. Labs reviewed and are reassuring  - possible mild dehydration btu WBC normal, LFTs normal, no signs of sepsis or AKI.   [CI]    Clinical Course User Index [CI] Shaune PollackIsaacs, Antoinett Dorman, MD      Plan to f/u CT. Likely d/c with phenergan, bentyl. Suspect acute on chronic abd pain, possibly 2/2 gastroparesis.  Final Clinical Impressions(s) / ED Diagnoses   Final  diagnoses:  Nausea and vomiting in adult patient  Epigastric abdominal pain  Pulmonary nodule seen on imaging study       Shaune Pollack, MD 07/10/18 1426

## 2018-07-10 NOTE — ED Provider Notes (Signed)
Care assumed from Dr. Ellender Hose at sign over of patient, please see their notes for full documentation of patient's complaint/HPI. Briefly, pt here with 1 wk of n/v/epigastric abd pain, hx of similar in the past. Results so far show betaHCG neg, CBC w/diff WNL, CMP with marginally elevated Cr 1.13 and alk phos 160 similar to prior, lipase WNL. Awaiting CT abd/pelv to r/o intraabdominal pathology. Plan is to reassess after that, if negative then PO challenge and likely d/c with phenergan and bentyl rx's which have already been written by Dr. Ellender Hose.   Physical Exam  BP 139/84   Pulse 71   Temp 98.6 F (37 C) (Oral)   Resp 15   Ht 5' 9"  (1.753 m)   Wt (!) 149.7 kg   SpO2 99%   BMI 48.73 kg/m   Physical Exam Gen: afebrile, VSS, NAD HEENT: EOMI Resp: no resp distress CV: rate WNL Abd: Soft, obese but nondistended, +BS throughout, with mild epigastric TTP, some milder RUQ and LUQ TTP but not as focal as the epigastrum, no r/g/r, neg murphy's, neg mcburney's MsK: moving all extremities with ease Neuro: A&O  ED Course/Procedures   Clinical Course as of Jul 09 1400  Sat Jul 10, 2018  1126 43 yo F with PMHx as above here w/ nausea, vomiting. Suspect gastritis, duodenitis, viral GI syndrome. No fevers. Abdomen soft. Labs, imaging pending. S/p cholecystectomy.   [CI]  1252 EJ placed by myself per request. Labs reviewed and are reassuring - possible mild dehydration btu WBC normal, LFTs normal, no signs of sepsis or AKI.   [CI]    Clinical Course User Index [CI] Duffy Bruce, MD    Results for orders placed or performed during the hospital encounter of 07/10/18  CBC with Differential  Result Value Ref Range   WBC 8.1 4.0 - 10.5 K/uL   RBC 4.02 3.87 - 5.11 MIL/uL   Hemoglobin 12.4 12.0 - 15.0 g/dL   HCT 38.7 36.0 - 46.0 %   MCV 96.3 80.0 - 100.0 fL   MCH 30.8 26.0 - 34.0 pg   MCHC 32.0 30.0 - 36.0 g/dL   RDW 13.2 11.5 - 15.5 %   Platelets 333 150 - 400 K/uL   nRBC 0.0 0.0 - 0.2 %    Neutrophils Relative % 70 %   Neutro Abs 5.6 1.7 - 7.7 K/uL   Lymphocytes Relative 24 %   Lymphs Abs 2.0 0.7 - 4.0 K/uL   Monocytes Relative 5 %   Monocytes Absolute 0.4 0.1 - 1.0 K/uL   Eosinophils Relative 1 %   Eosinophils Absolute 0.1 0.0 - 0.5 K/uL   Basophils Relative 0 %   Basophils Absolute 0.0 0.0 - 0.1 K/uL   Immature Granulocytes 0 %   Abs Immature Granulocytes 0.01 0.00 - 0.07 K/uL  Comprehensive metabolic panel  Result Value Ref Range   Sodium 138 135 - 145 mmol/L   Potassium 3.9 3.5 - 5.1 mmol/L   Chloride 100 98 - 111 mmol/L   CO2 27 22 - 32 mmol/L   Glucose, Bld 112 (H) 70 - 99 mg/dL   BUN 8 6 - 20 mg/dL   Creatinine, Ser 1.13 (H) 0.44 - 1.00 mg/dL   Calcium 9.7 8.9 - 10.3 mg/dL   Total Protein 8.3 (H) 6.5 - 8.1 g/dL   Albumin 4.1 3.5 - 5.0 g/dL   AST 23 15 - 41 U/L   ALT 14 0 - 44 U/L   Alkaline Phosphatase 160 (H) 38 -  126 U/L   Total Bilirubin 0.5 0.3 - 1.2 mg/dL   GFR calc non Af Amer 60 (L) >60 mL/min   GFR calc Af Amer >60 >60 mL/min   Anion gap 11 5 - 15  Lipase, blood  Result Value Ref Range   Lipase 26 11 - 51 U/L  I-Stat Beta hCG blood, ED ([facility], [facility], [facility] only)  Result Value Ref Range   I-stat hCG, quantitative <5.0 <5 mIU/mL   Comment 3           Ct Abdomen Pelvis Wo Contrast  Result Date: 07/10/2018 CLINICAL DATA:  44 year old with abdominal distension. Abdominal pain. EXAM: CT ABDOMEN AND PELVIS WITHOUT CONTRAST TECHNIQUE: Multidetector CT imaging of the abdomen and pelvis was performed following the standard protocol without IV contrast. COMPARISON:  03/02/2017 and chest CT 06/13/2017 FINDINGS: Lower chest: Stable 1.3 cm round nodule in the medial right lower lobe. No pleural effusions.Few hazy densities in the lingular region are nonspecific but may represent atelectasis. Hepatobiliary: Cholecystectomy.  No acute abnormality to the liver. Pancreas: Unremarkable. No pancreatic ductal dilatation or surrounding inflammatory changes. Mild fatty  infiltration involving the pancreatic head and neck region. Spleen: Normal in size without focal abnormality. Adrenals/Urinary Tract: Normal adrenal glands. Urinary bladder is unremarkable. Normal appearance of the kidneys without stones or hydronephrosis. Stomach/Bowel: Stomach is within normal limits. Appendix appears normal. No evidence of bowel wall thickening, distention, or inflammatory changes. Vascular/Lymphatic: No significant vascular findings are present. No enlarged abdominal or pelvic lymph nodes. Reproductive: Status post hysterectomy. No adnexal masses. Other: No ascites. Negative for free air. Small umbilical hernia containing fat. Musculoskeletal: No acute bone abnormality. IMPRESSION: 1. No acute findings in the abdomen or pelvis. 2. Stable 1.3 cm pulmonary nodule in the right lower lobe. This nodule is stable since the chest CT from 06/13/2017. Recommend follow-up to ensure at least 2 years of stability. Electronically Signed   By: Markus Daft M.D.   On: 07/10/2018 13:44     Meds ordered this encounter  Medications  . metoCLOPramide (REGLAN) injection 10 mg  . diphenhydrAMINE (BENADRYL) injection 25 mg  . sodium chloride 0.9 % bolus 2,000 mL  . DISCONTD: alum & mag hydroxide-simeth (MAALOX/MYLANTA) 200-200-20 MG/5ML suspension 30 mL  . DISCONTD: lidocaine (XYLOCAINE) 2 % viscous mouth solution 15 mL  . morphine 4 MG/ML injection 4 mg  . promethazine (PHENERGAN) 25 MG tablet    Sig: Take 1 tablet (25 mg total) by mouth every 6 (six) hours as needed for nausea or vomiting.    Dispense:  20 tablet    Refill:  0  . dicyclomine (BENTYL) 20 MG tablet    Sig: Take 1 tablet (20 mg total) by mouth 4 (four) times daily -  before meals and at bedtime for 5 days.    Dispense:  20 tablet    Refill:  0  . AND Linked Order Group   . alum & mag hydroxide-simeth (MAALOX/MYLANTA) 200-200-20 MG/5ML suspension 30 mL   . lidocaine (XYLOCAINE) 2 % viscous mouth solution 15 mL  . dicyclomine  (BENTYL) 20 MG tablet    Sig: Take 1 tablet (20 mg total) by mouth 4 (four) times daily -  before meals and at bedtime for 5 days.    Dispense:  20 tablet    Refill:  0    Order Specific Question:   Supervising Provider    Answer:   MILLER, BRIAN [[id number]]  . promethazine (PHENERGAN) 25 MG tablet    Sig:  Take 1 tablet (25 mg total) by mouth every 6 (six) hours as needed for nausea or vomiting.    Dispense:  10 tablet    Refill:  0    Order Specific Question:   Supervising Provider    Answer:   Sabra Heck, BRIAN [[id number]]  . ondansetron (ZOFRAN ODT) 4 MG disintegrating tablet    Sig: Take 1 tablet (4 mg total) by mouth every 8 (eight) hours as needed for nausea or vomiting.    Dispense:  15 tablet    Refill:  0    Order Specific Question:   Supervising Provider    Answer:   Noemi Chapel [[id number]]     MDM:   ICD-10-CM   1. Nausea and vomiting in adult patient R11.2   2. Epigastric abdominal pain R10.13   3. Pulmonary nodule seen on imaging study R91.1     2:21 PM CT Abd/pelv with no acute findings in the abd/pelv, stable 1.3cm pulm nodule which has been stable since 06/13/17, recommends f/up to ensure 58yr of stability, will have her f/up with her PCP for this. Pt feeling better and tolerating PO well. Pt declines wanting to wait for U/A, states she has no symptoms of UTI, no lower abd tenderness on exam; advised pt on performing one but she declined. Doubt need for further emergent work up at this time. Advised OTC remedies for symptomatic relief, discussed use of bentyl/phenergan (pt not using the pharmacy they were called in to, so will give written rx's of these); pt also requesting rx for zofran as this works well for her, will give this to her in addition as well. Advised f/up with PCP in 1wk for recheck. I explained the diagnosis and have given explicit precautions to return to the ER including for any other new or worsening symptoms. The patient understands and accepts the medical plan as it's  been dictated and I have answered their questions. Discharge instructions concerning home care and prescriptions have been given. The patient is STABLE and is discharged to home in good condition.    [address] [place]05/02/20 1421    IDuffy Bruce MD 07/11/18 [phone]

## 2018-07-10 NOTE — ED Notes (Signed)
Patient verbalizes understanding of discharge instructions. Opportunity for questioning and answers were provided. Pt discharged from ED. 

## 2018-07-10 NOTE — ED Notes (Signed)
Attempted to obtain urine specimen; Pt unable to provide one at this time 

## 2018-07-10 NOTE — Progress Notes (Signed)
PIV attempted with ultrasound. IV in vein but unable to thread. Patient requested IV RN to stop in the middle of placement. She states "no one is listening to me." When I asked her what she wanted [place] to hear, she said "I want an EJ, one stick." Primary RN made aware.

## 2018-07-10 NOTE — ED Notes (Signed)
Pt is very upset & angry with staff for having her blood drawn before getting approached to have an IV put in. The RN has tried to calm her down, however she is not easily swayed & has refused her po meds & is willing to wait for IV team for her IV because she states that she is a hard stick (per pt).

## 2018-07-10 NOTE — ED Notes (Signed)
Patient transported to CT 

## 2018-07-10 NOTE — ED Triage Notes (Addendum)
Pt stated that she has been vomiting for one week & has not held anything solid down in that time frame as well. She has taken medication for nausea at home with no relief.

## 2018-07-13 ENCOUNTER — Other Ambulatory Visit (HOSPITAL_COMMUNITY): Payer: Self-pay | Admitting: Nurse Practitioner

## 2018-07-13 DIAGNOSIS — M545 Low back pain, unspecified: Secondary | ICD-10-CM

## 2018-07-20 ENCOUNTER — Ambulatory Visit (HOSPITAL_COMMUNITY)
Admission: RE | Admit: 2018-07-20 | Discharge: 2018-07-20 | Disposition: A | Discharge status: Home / Self Care | Payer: Medicaid Other | Source: Ambulatory Visit | Attending: Nurse Practitioner | Admitting: Nurse Practitioner

## 2018-07-20 ENCOUNTER — Other Ambulatory Visit: Payer: Self-pay

## 2018-07-20 DIAGNOSIS — M545 Low back pain, unspecified: Secondary | ICD-10-CM

## 2018-10-06 ENCOUNTER — Emergency Department (HOSPITAL_COMMUNITY)
Admission: EM | Admit: 2018-10-06 | Discharge: 2018-10-06 | Disposition: A | Discharge status: Home / Self Care | Payer: Medicaid Other | Attending: Emergency Medicine | Admitting: Emergency Medicine

## 2018-10-06 ENCOUNTER — Encounter (HOSPITAL_COMMUNITY): Payer: Self-pay | Admitting: Emergency Medicine

## 2018-10-06 ENCOUNTER — Emergency Department (HOSPITAL_COMMUNITY): Payer: Medicaid Other

## 2018-10-06 ENCOUNTER — Other Ambulatory Visit: Payer: Self-pay

## 2018-10-06 DIAGNOSIS — Z79899 Other long term (current) drug therapy: Secondary | ICD-10-CM | POA: Insufficient documentation

## 2018-10-06 DIAGNOSIS — R0789 Other chest pain: Secondary | ICD-10-CM | POA: Insufficient documentation

## 2018-10-06 DIAGNOSIS — R220 Localized swelling, mass and lump, head: Secondary | ICD-10-CM | POA: Diagnosis present

## 2018-10-06 LAB — CBC WITH DIFFERENTIAL/PLATELET
Abs Immature Granulocytes: 0.03 10*3/uL (ref 0.00–0.07)
Basophils Absolute: 0 10*3/uL (ref 0.0–0.1)
Basophils Relative: 0 %
Eosinophils Absolute: 0.1 10*3/uL (ref 0.0–0.5)
Eosinophils Relative: 1 %
HCT: 39.4 % (ref 36.0–46.0)
Hemoglobin: 12.6 g/dL (ref 12.0–15.0)
Immature Granulocytes: 0 %
Lymphocytes Relative: 15 %
Lymphs Abs: 1.5 10*3/uL (ref 0.7–4.0)
MCH: 30.6 pg (ref 26.0–34.0)
MCHC: 32 g/dL (ref 30.0–36.0)
MCV: 95.6 fL (ref 80.0–100.0)
Monocytes Absolute: 0.5 10*3/uL (ref 0.1–1.0)
Monocytes Relative: 5 %
Neutro Abs: 7.8 10*3/uL — ABNORMAL HIGH (ref 1.7–7.7)
Neutrophils Relative %: 79 %
Platelets: 308 10*3/uL (ref 150–400)
RBC: 4.12 MIL/uL (ref 3.87–5.11)
RDW: 12.6 % (ref 11.5–15.5)
WBC: 9.9 10*3/uL (ref 4.0–10.5)
nRBC: 0 % (ref 0.0–0.2)

## 2018-10-06 LAB — BASIC METABOLIC PANEL
Anion gap: 11 (ref 5–15)
BUN: 5 mg/dL — ABNORMAL LOW (ref 6–20)
CO2: 25 mmol/L (ref 22–32)
Calcium: 9.6 mg/dL (ref 8.9–10.3)
Chloride: 102 mmol/L (ref 98–111)
Creatinine, Ser: 0.89 mg/dL (ref 0.44–1.00)
GFR calc Af Amer: >60 mL/min (ref >60)
GFR calc non Af Amer: >60 mL/min (ref >60)
Glucose, Bld: 101 mg/dL — ABNORMAL HIGH (ref 70–99)
Potassium: 3.8 mmol/L (ref 3.5–5.1)
Sodium: 138 mmol/L (ref 135–145)

## 2018-10-06 MED ORDER — ONDANSETRON HCL 4 MG/2ML IJ SOLN
4.0000 mg | Freq: Once | INTRAMUSCULAR | Status: AC
  Administered 2018-10-06: 4 mg via INTRAVENOUS
  Filled 2018-10-06: qty 2

## 2018-10-06 MED ORDER — MORPHINE SULFATE (PF) 4 MG/ML IV SOLN
4.0000 mg | Freq: Once | INTRAVENOUS | Status: AC
  Administered 2018-10-06: 4 mg via INTRAVENOUS
  Filled 2018-10-06: qty 1

## 2018-10-06 MED ORDER — CHLORHEXIDINE GLUCONATE 0.12 % MT SOLN: 15.0000 mL | Freq: Two times a day (BID) | OROMUCOSAL | 0 refills | Status: AC

## 2018-10-06 MED ORDER — IOHEXOL 300 MG/ML  SOLN
75.0000 mL | Freq: Once | INTRAMUSCULAR | Status: AC | PRN
  Administered 2018-10-06: 11:00:00 75 mL via INTRAVENOUS

## 2018-10-06 MED ORDER — OXYCODONE-ACETAMINOPHEN 5-325 MG PO TABS
1.0000 | ORAL_TABLET | Freq: Once | ORAL | Status: AC
  Administered 2018-10-06: 12:00:00 1 via ORAL
  Filled 2018-10-06: qty 1

## 2018-10-06 MED ORDER — AMOXICILLIN-POT CLAVULANATE 875-125 MG PO TABS
1.0000 | ORAL_TABLET | Freq: Once | ORAL | Status: DC
  Filled 2018-10-06: qty 1

## 2018-10-06 MED ORDER — AMOXICILLIN-POT CLAVULANATE 875-125 MG PO TABS: 1.0000 | ORAL_TABLET | Freq: Two times a day (BID) | ORAL | 0 refills | Status: AC

## 2018-10-06 NOTE — ED Notes (Signed)
Patient transported to CT 

## 2018-10-06 NOTE — Discharge Instructions (Signed)
Take antibiotics as directed. Please take all of your antibiotics until finished.  Swish and spit 15 mL of chlorhexidine mouthwash for 30 seconds times daily.  You can also gargle warm salt water up to 5 times daily.  Both of these rinses can help to remove bacteria from the mouth.  You can also use viscous lidocaine every 3 hours to help numb the area.  You can apply a cool compress for 15 to 20 minutes, but avoid applying heat to the area.  Apply warm compresses to jaw throughout the day. You can take Tylenol or Ibuprofen as directed for pain. You can alternate Tylenol and Ibuprofen every 4 hours. If you take Tylenol at 1pm, then you can take Ibuprofen at 5pm. Then you can take Tylenol again at 9pm.   The exam and treatment you received today has been provided on an emergency basis only. This is not a substitute for complete medical or dental care. If your problem worsens or new symptoms (problems) appear, and you are unable to arrange prompt follow-up care with your dentist, call or return to this location. If you do not have a dentist, please follow-up with one on the list provided  CALL YOUR DENTIST OR RETURN IMMEDIATELY IF you develop a fever, rash, difficulty breathing or swallowing, neck or facial swelling, or other potentially serious concerns. Information for the on-call dentist Dr. Haig Prophet has been provided in your discharge report.  Please call his office today to schedule follow-up appointment.

## 2018-10-06 NOTE — ED Notes (Signed)
Pt walked out of room, when asked if she needed help she said "No, I don't. [facility]." Pt then preceded out the exit doors. MD notified

## 2018-10-06 NOTE — ED Provider Notes (Signed)
[facility] Provider Note   CSN: [id number] Arrival date & time: 10/06/18  [id number]    History   Chief Complaint Facial swelling  HPI Emma Lowe is a 43 y.o. female past medical history of GERD presents to emergency department today with chief complaint of left-sided facial pain x2 days.  Patient describes the pain as sharp.  The pain has been constant and she rates it 10 out of 10 in severity. Pain does not radiate. She had felt fine yesterday however when she laid down for bed last night she noticed swelling to the left side of her face.  Took Motrin without relief. Denies modifying factors. Patient states she had similar problem 1 year ago and had to have a tooth pulled.  She knows she has broken teeth on left upper jaw for states they have been broken for a while now.  Pt reports chest wall pain x 2 months. She states "That's not why I'm here. My face hurts." Denies fever, chills, voice change, inability to control secretions, nausea/vomiting,  dysphagia, odynophagia, drainage or trauma, shortness of breath, diaphoresis, palpitations.  History provided by patient with additional history obtained from chart review.      Past Medical History:  Diagnosis Date   GERD (gastroesophageal reflux disease)    Neuromuscular disorder (HCC)    "nerve damage left side"   Sleep apnea     Patient Active Problem List   Diagnosis Date Noted   Hypokalemia 06/24/2017   Dental abscess 06/23/2017   Facial cellulitis 06/23/2017   Double vision 06/23/2017   GERD (gastroesophageal reflux disease)     Past Surgical History:  Procedure Laterality Date   ABDOMINAL HYSTERECTOMY     CESAREAN SECTION     CHOLECYSTECTOMY       OB History   No obstetric history on file.      Home Medications    Prior to Admission medications   Medication Sig Start Date End Date Taking? Authorizing Provider  amoxicillin-clavulanate (AUGMENTIN) 875-125 MG tablet  Take 1 tablet by mouth every 12 (twelve) hours. 10/06/18   Leilany Digeronimo E, PA-C  chlorhexidine (PERIDEX) 0.12 % solution Use as directed 15 mLs in the mouth or throat 2 (two) times daily. 10/06/18   Sary Bogie E, PA-C  dicyclomine (BENTYL) 20 MG tablet Take 1 tablet (20 mg total) by mouth 4 (four) times daily -  before meals and at bedtime for 5 days. 07/10/18 07/15/18  Shaune PollackIsaacs, Cameron, MD  dicyclomine (BENTYL) 20 MG tablet Take 1 tablet (20 mg total) by mouth 4 (four) times daily -  before meals and at bedtime for 5 days. 07/10/18 07/15/18  Street, Mercedes, PA-C  ondansetron (ZOFRAN ODT) 4 MG disintegrating tablet Take 1 tablet (4 mg total) by mouth every 8 (eight) hours as needed for nausea or vomiting. 01/13/18   McDonald, Mia A, PA-C  ondansetron (ZOFRAN ODT) 4 MG disintegrating tablet Take 1 tablet (4 mg total) by mouth every 8 (eight) hours as needed for nausea or vomiting. 07/10/18   Street, Mercedes, PA-C  ondansetron (ZOFRAN) 4 MG tablet Take 1 tablet (4 mg total) by mouth every 6 (six) hours as needed for nausea. 06/26/17   Rodolph Bonghompson, Daniel V, MD  oxyCODONE-acetaminophen (PERCOCET) 10-325 MG tablet Take 1 tablet by mouth 4 (four) times daily. 12/15/17   [provider]  pantoprazole (PROTONIX) 40 MG tablet Take 1 tablet (40 mg total) by mouth daily. 06/26/17   Rodolph Bonghompson, Daniel V, MD  potassium  chloride (K-DUR) 10 MEQ tablet Take 1 tablet (10 mEq total) by mouth daily. 01/06/18 02/05/18  Mortis, Jerrel IvoryGabrielle I, PA-C  pregabalin (LYRICA) 50 MG capsule Take 50 mg by mouth daily.    [provider]  promethazine (PHENERGAN) 25 MG tablet Take 1 tablet (25 mg total) by mouth every 6 (six) hours as needed for nausea or vomiting. 07/10/18   Shaune PollackIsaacs, Cameron, MD  promethazine (PHENERGAN) 25 MG tablet Take 1 tablet (25 mg total) by mouth every 6 (six) hours as needed for nausea or vomiting. 07/10/18   Street, Holly HillMercedes, PA-C    Family History Family History  Problem Relation Age of Onset    Diabetes Mellitus II Father     Social History Social History   Tobacco Use   Smoking status: Never Smoker   Smokeless tobacco: Never Used  Substance Use Topics   Alcohol use: No    Frequency: Never   Drug use: No     Allergies   Other   Review of Systems Review of Systems  Constitutional: Negative for chills and fever.  HENT: Positive for facial swelling. Negative for congestion, ear discharge, ear pain, sinus pressure, sinus pain and sore throat.   Eyes: Negative for pain and redness.  Respiratory: Negative for cough and shortness of breath.   Gastrointestinal: Negative for abdominal pain, constipation, diarrhea, nausea and vomiting.  Genitourinary: Negative for dysuria and hematuria.  Musculoskeletal: Negative for back pain and neck pain.  Skin: Negative for wound.  Neurological: Negative for weakness, numbness and headaches.     Physical Exam Updated Vital Signs BP (!) 187/120 (BP Location: Right Arm)    Pulse 74    Temp 98.2 F (36.8 C) (Oral)    Resp 17    Ht 5\' 8"  (1.727 m)    Wt (!) 149.7 kg    SpO2 98%    BMI 50.18 kg/m   Physical Exam Vitals signs and nursing note reviewed.  Constitutional:      General: She is not in acute distress.    Appearance: She is not ill-appearing.  HENT:     Head: Normocephalic and atraumatic.     Comments: Mild swelling to left cheek    Right Ear: Tympanic membrane and external ear normal.     Left Ear: Tympanic membrane and external ear normal.     Nose: Nose normal.     Mouth/Throat:     Mouth: Mucous membranes are moist.     Pharynx: Oropharynx is clear.     Comments: Poor dentition with chronic decay. Multiple broken teeth throughout mouth. No obvious abscess or area of fluctuance. Mouth opening to at least 3 finger widths. Handles oral secretions without difficulty. Gumline palpated no obvious signs of infection including no warmth, redness, abscess, tenderness. Posterior oropharynx clear with no signs of  infection, uvula is midline and rises with phonation.    No swelling or tenderness to the submental or submandibular regions. No swelling or tenderness into the soft tissues of the neck.Full active range of motion of the jaw. Neck is supple with full active range of motion, no tenderness to palpation of the soft tissues.    Eyes:     General: No scleral icterus.       Right eye: No discharge.        Left eye: No discharge.     Extraocular Movements: Extraocular movements intact.     Conjunctiva/sclera: Conjunctivae normal.     Pupils: Pupils are equal, round, and reactive  to light.  Neck:     Musculoskeletal: Normal range of motion.     Vascular: No JVD.  Cardiovascular:     Rate and Rhythm: Normal rate and regular rhythm.     Pulses: Normal pulses.          Radial pulses are 2+ on the right side and 2+ on the left side.     Heart sounds: Normal heart sounds.  Pulmonary:     Comments: Lungs clear to auscultation in all fields. Symmetric chest rise. No wheezing, rales, or rhonchi. Chest:     Chest wall: No tenderness.  Abdominal:     Comments: Abdomen is soft, non-distended, and non-tender in all quadrants. No rigidity, no guarding. No peritoneal signs.  Musculoskeletal: Normal range of motion.  Skin:    General: Skin is warm and dry.     Capillary Refill: Capillary refill takes less than 2 seconds.  Neurological:     Mental Status: She is oriented to person, place, and time.     GCS: GCS eye subscore is 4. GCS verbal subscore is 5. GCS motor subscore is 6.     Comments: Fluent speech, no facial droop.  Psychiatric:        Behavior: Behavior normal.      ED Treatments / Results  Labs (all labs ordered are listed, but only abnormal results are displayed) Labs Reviewed  BASIC METABOLIC PANEL - Abnormal; Notable for the following components:      Result Value   Glucose, Bld 101 (*)    BUN 5 (*)    All other components within normal limits  CBC WITH  DIFFERENTIAL/PLATELET - Abnormal; Notable for the following components:   Neutro Abs 7.8 (*)    All other components within normal limits    EKG None  Radiology Ct Maxillofacial W Contrast  Result Date: 10/06/2018 CLINICAL DATA:  43 year old female with left facial pain, swelling. History of tooth abscess. EXAM: CT MAXILLOFACIAL WITH CONTRAST TECHNIQUE: Multidetector CT imaging of the maxillofacial structures was performed with intravenous contrast. Multiplanar CT image reconstructions were also generated. CONTRAST:  14mL OMNIPAQUE IOHEXOL 300 MG/ML  SOLN COMPARISON:  Face CT 06/23/2017. FINDINGS: Osseous: Mandible intact and normally located. Residual dentition is carious. Prominent periapical lucency with cortical breakthrough of the maxillary alveolar process at the left lateral maxillary incisor (series 2, image 36). There is associated regional soft tissue swelling, and possibly a tiny 3 millimeter subperiosteal abscess (series 7, image 54). Other osseous structures appear intact. Hyperostosis of the calvarium, normal variant. Orbits: Intact orbital walls. Orbits soft tissues appears symmetric and normal. Sinuses: Clear throughout. Soft tissues: Negative visible thyroid, larynx, pharynx, parapharyngeal spaces, retropharyngeal space, sublingual space, submandibular spaces, parotid spaces, masticator spaces. Mild reactive appearing left level 1 lymph nodes. Suboptimal intravascular contrast but the major vascular structures in the neck appear to be patent. Limited intracranial: Possible chronic partially empty sella. Otherwise negative visible brain parenchyma. IMPRESSION: 1. Odontogenic infection from the left lateral maxillary incisor. Regional cellulitis and suspected tiny 3 mm subperiosteal abscess. See series 7, image 54. 2. Carious residual dentition.  No other acute findings in the face. Electronically Signed   By: Genevie Ann M.D.   On: 10/06/2018 11:15    Procedures Procedures (including  critical care time)  Medications Ordered in ED Medications  morphine 4 MG/ML injection 4 mg (4 mg Intravenous Given 10/06/18 1041)  ondansetron (ZOFRAN) injection 4 mg (4 mg Intravenous Given 10/06/18 1039)  iohexol (OMNIPAQUE) 300 MG/ML solution  75 mL (75 mLs Intravenous Contrast Given 10/06/18 1051)  oxyCODONE-acetaminophen (PERCOCET/ROXICET) 5-325 MG per tablet 1 tablet (1 tablet Oral Given 10/06/18 1215)     Initial Impression / Assessment and Plan / ED Course  I have reviewed the triage vital signs and the nursing notes.  Pertinent labs & imaging results that were available during my care of the patient were reviewed by me and considered in my medical decision making (see chart for details).  43 year old female presents with left-sided facial swelling. She is afebrile. She is nontoxic in appearance, swallowing secretions well. No signs of obvious dental abscess on exam. Airway is intact. Blood pressure is elevated in triage, pt does not have known diagnosis of hypertension. Will attempt pain control and recheck. No chest wall tenderness on exam, with report of chest wall pain x2 months doubt ACS etiology. CT maxillofacial with cellulitis and  3 mm subperiosteal abscess. Labs unremarkable. On reassessment pt reports pain improved after morphine. Pt's blood pressure rechecked and she is still hypertensive. I recommend she follow up with pcp to have it rechecked and will possibly need htn medications if hypertension persists. She is angry I will not discharge her with narcotics. She leaves before the nurse finishes discharge paperwork.  I gave patient referral to dentist,, prescription for antibiotics, t and stressed the importance of dental follow up for ultimate management of dental pain. Patient voices understanding and is agreeable to plan.  This note was prepared using Dragon voice recognition software and may include unintentional dictation errors due to the inherent limitations of voice  recognition software.    Final Clinical Impressions(s) / ED Diagnoses   Final diagnoses:  Left facial swelling    ED Discharge Orders         Ordered    amoxicillin-clavulanate (AUGMENTIN) 875-125 MG tablet  Every 12 hours     10/06/18 1210    chlorhexidine (PERIDEX) 0.12 % solution  2 times daily     10/06/18 1210           Zvi Duplantis, Caroleen HammanKaitlyn E, PA-C 10/07/18 [id number]    Rolan BuccoBelfi, Melanie, MD 10/07/18 1154

## 2018-10-06 NOTE — ED Triage Notes (Signed)
Pt here with c/o left sided face pain , pt has had this before when she had a tooth abscess pt also with some chest wall pain for 2 months

## 2018-10-06 NOTE — ED Notes (Signed)
Pt requested the see the PA before she left & to get her first dose of her ABT here at the hospital, EDP was made known & ABT was retrieved from [facility]. By the time I returned the pt room she had left AMA.

## 2019-12-04 IMAGING — CR DG CHEST 2V
2 series · 2 of 2 positions shown · non-contrast
Comparison: Chest CT June 13, 2017 and chest radiograph June 13, 2017

CLINICAL DATA: Chest pain and shortness of breath

EXAM:
CHEST - 2 VIEW

[w chest lat]
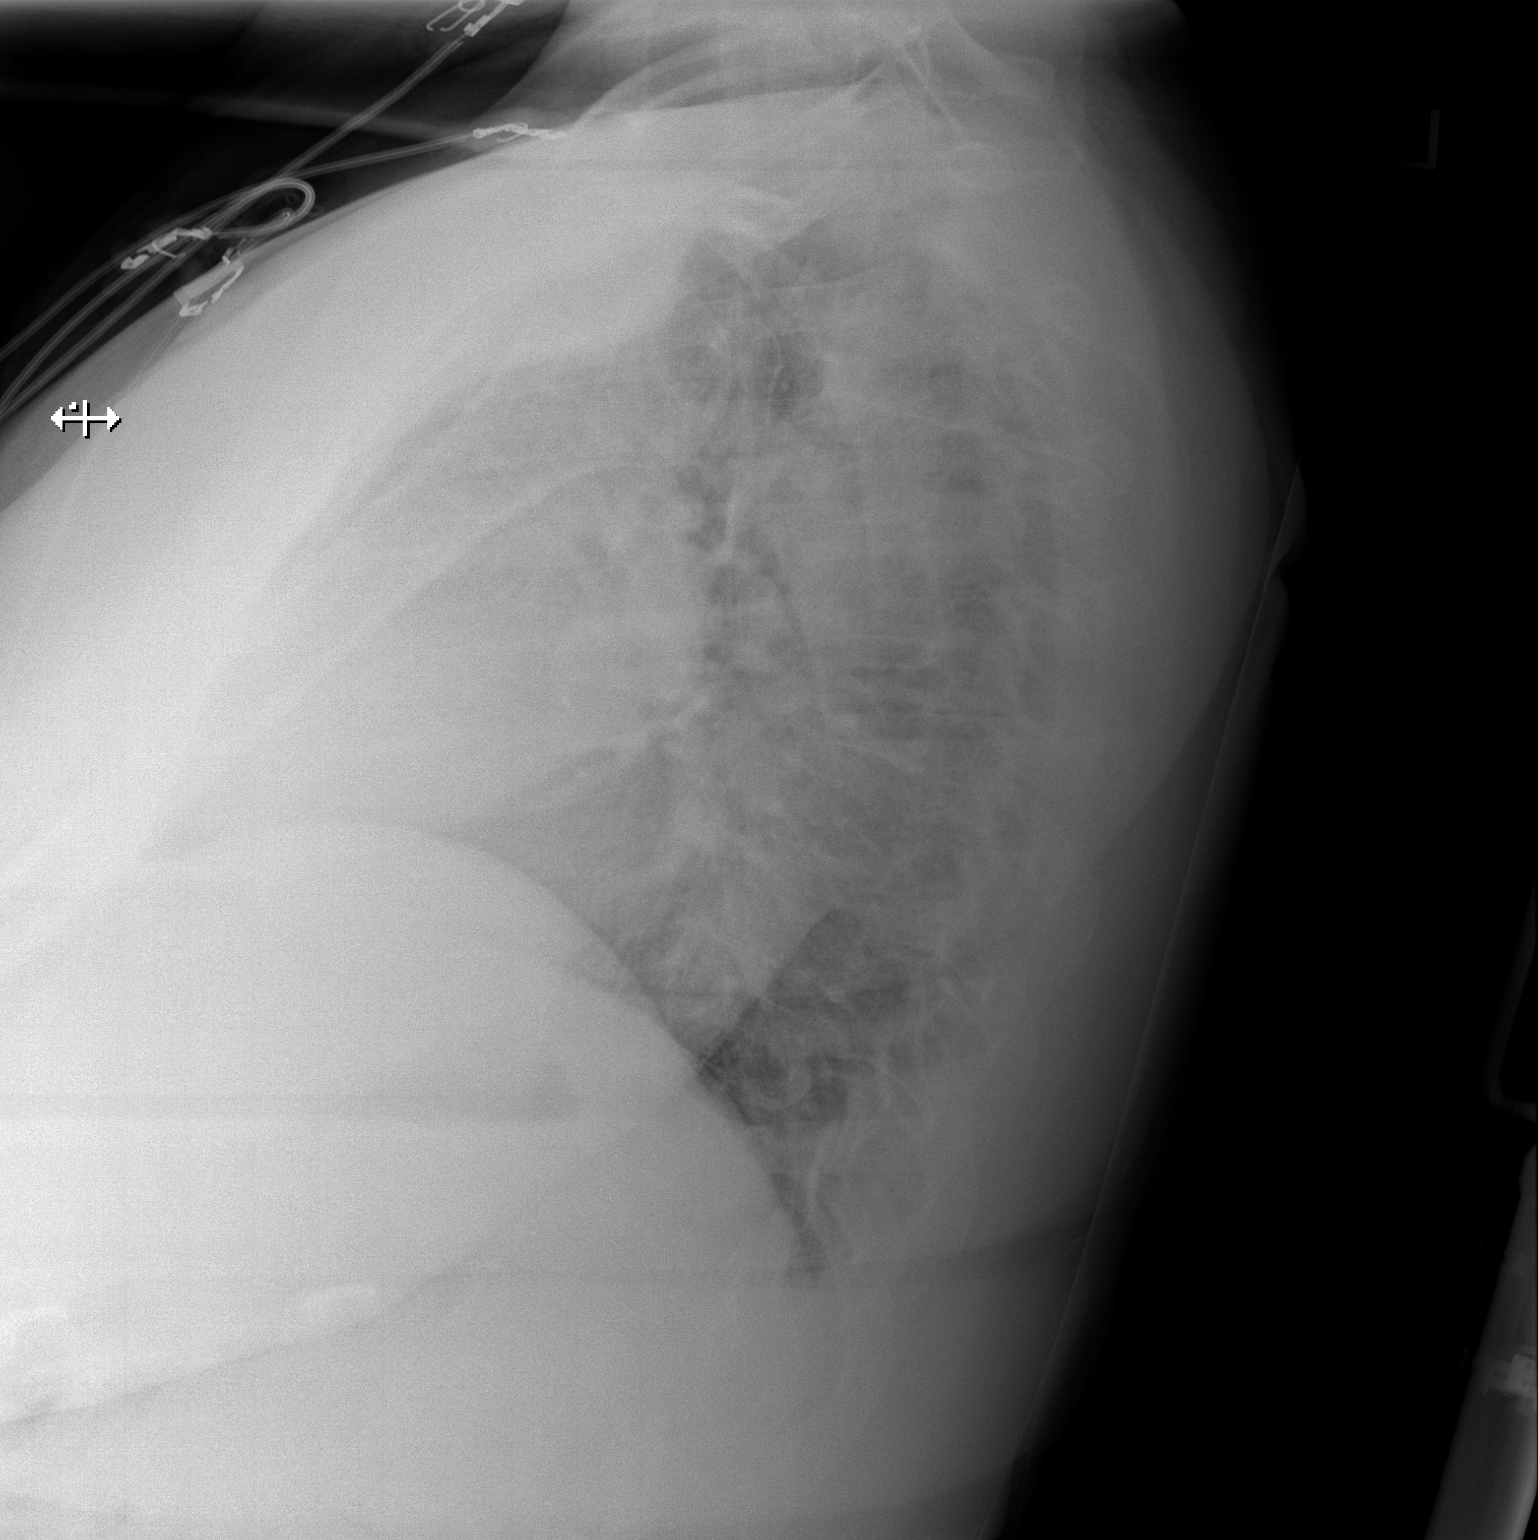

[x chest ap]
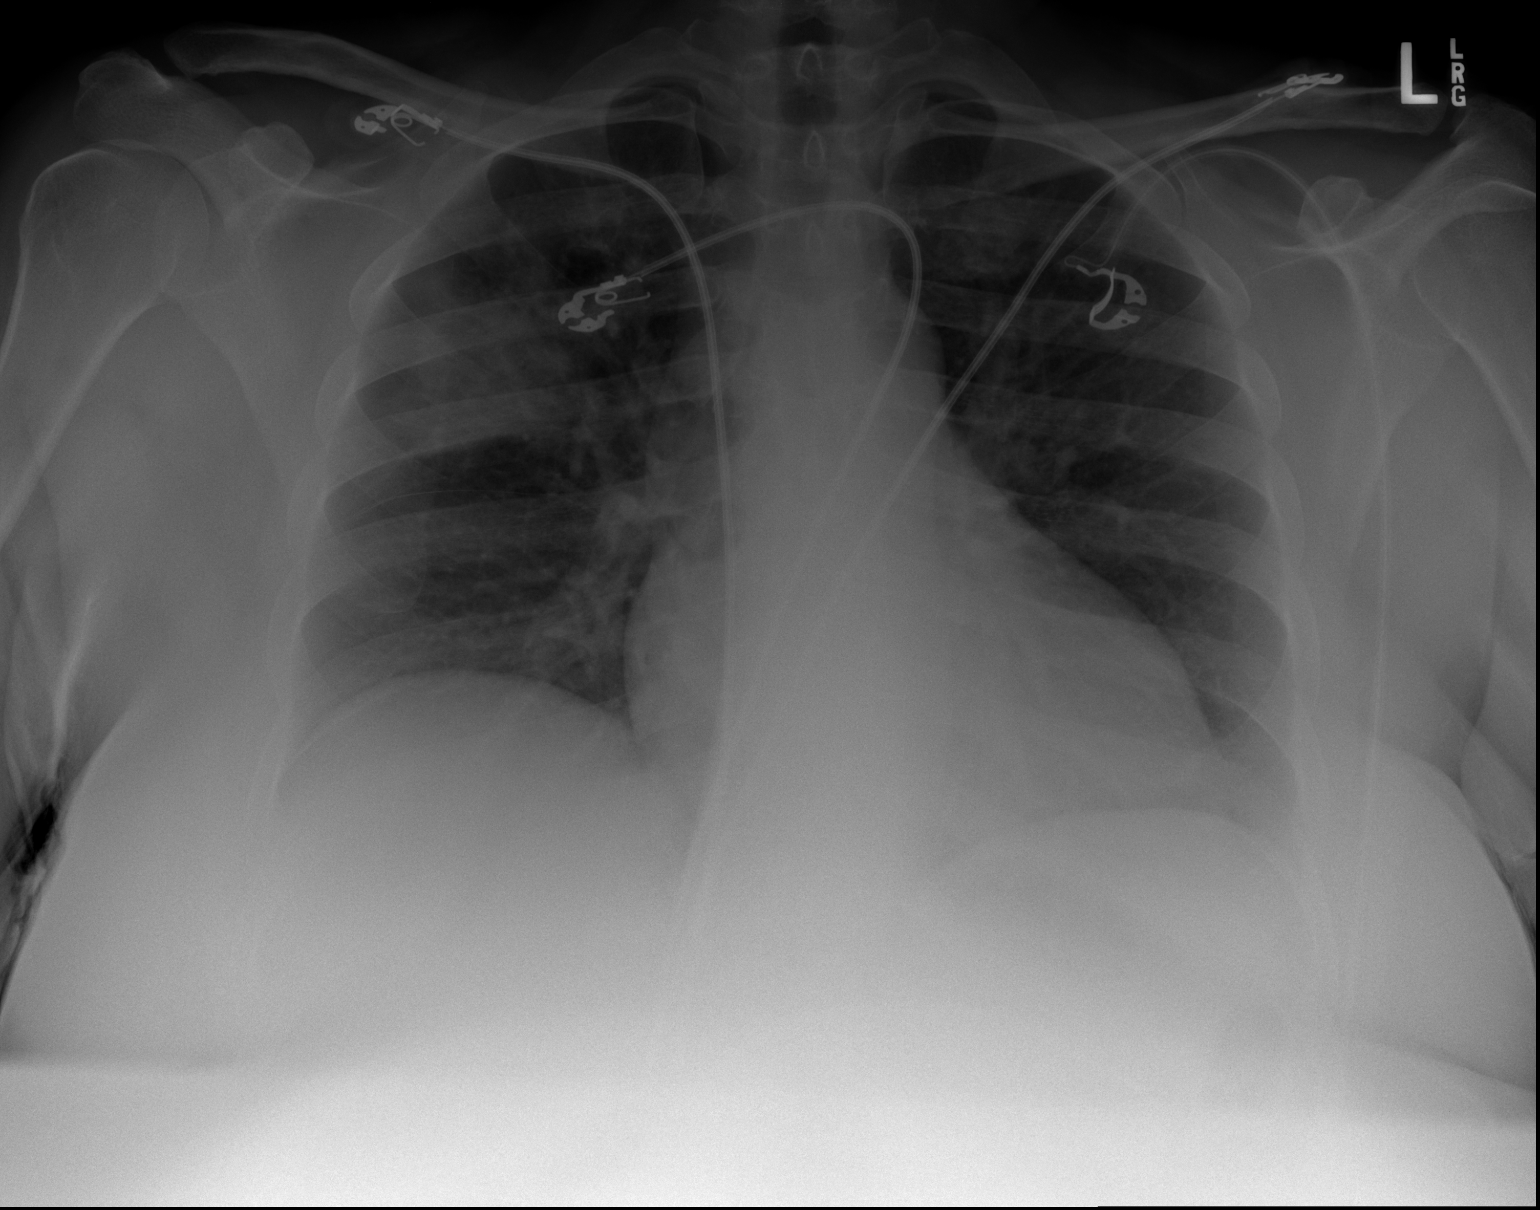

[2 of 2 positions shown; findings below may reference images not displayed]

FINDINGS: There is airspace consolidation in the posterior segment of the
right upper lobe consistent with pneumonia. The lungs elsewhere
clear. Heart is upper normal in size with pulmonary vascularity
normal. No adenopathy.
IMPRESSION: Posterior segment right upper lobe pneumonia. Lungs elsewhere clear.
Stable cardiac silhouette.

## 2020-09-02 IMAGING — CT CT MAXILLOFACIAL WITH CONTRAST
3 series · 15 of 47 positions shown, 18 images · IV contrast (omnipaque)
Comparison: Face CT 06/23/2017.

CLINICAL DATA: 43-year-old female with left facial pain, swelling.
History of tooth abscess.

EXAM:
CT MAXILLOFACIAL WITH CONTRAST
TECHNIQUE: Multidetector CT imaging of the maxillofacial structures was
performed with intravenous contrast. Multiplanar CT image
reconstructions were also generated.
CONTRAST:  75mL OMNIPAQUE IOHEXOL 300 MG/ML  SOLN

[Series 3: sinus 2.0 h30s · axial · 0.36mm/px · z∈[+1430,+1594]mm · 9 of 96 slices shown, 12 images]
[im 7/96  brain]
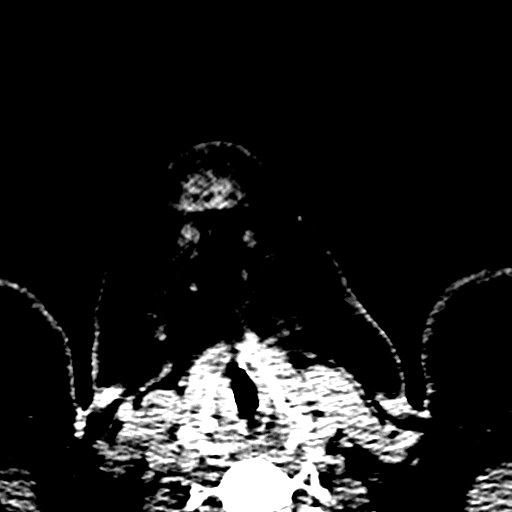
[im 7/96  bone]
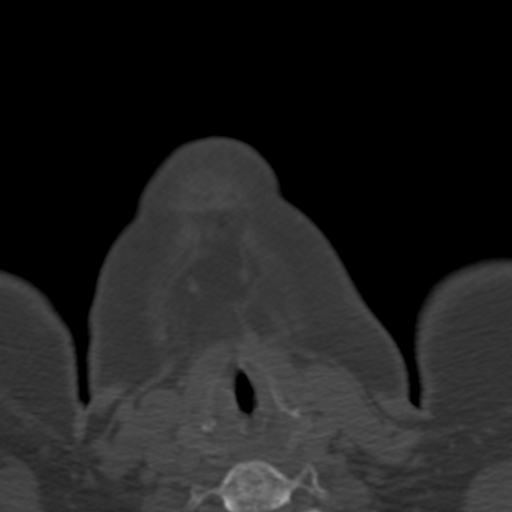
[im 17/96  bone]
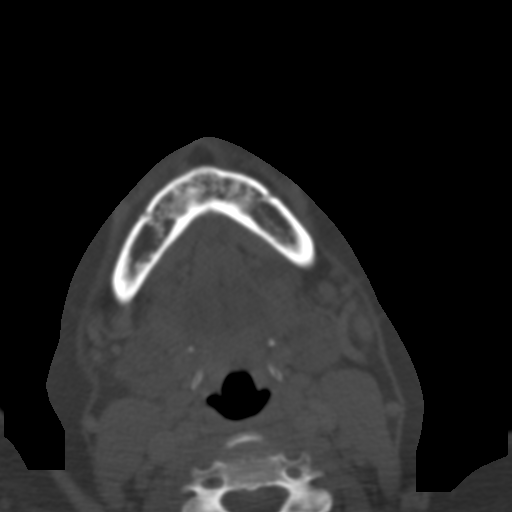
[im 27/96  bone]
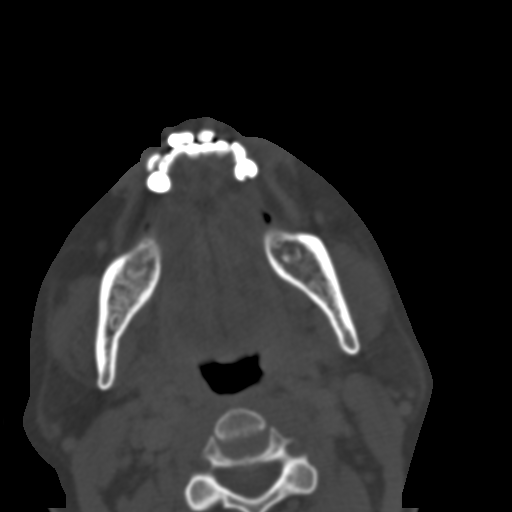
[im 37/96  bone]
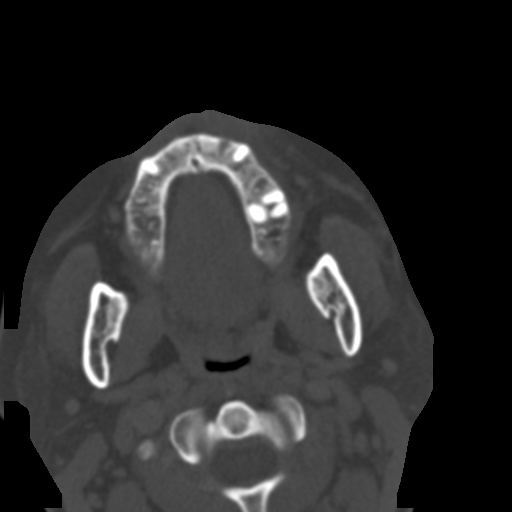
[im 50/96  brain]
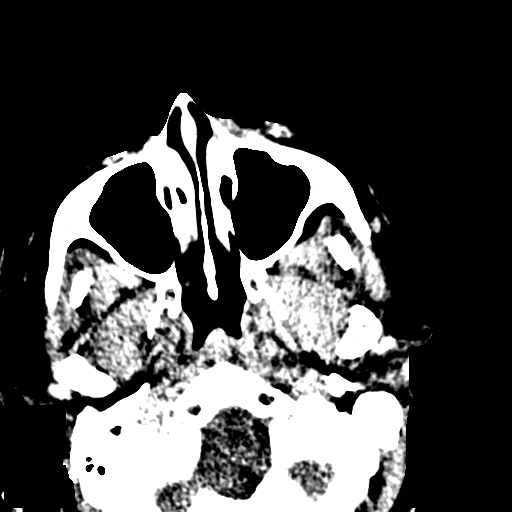
[im 50/96  bone]
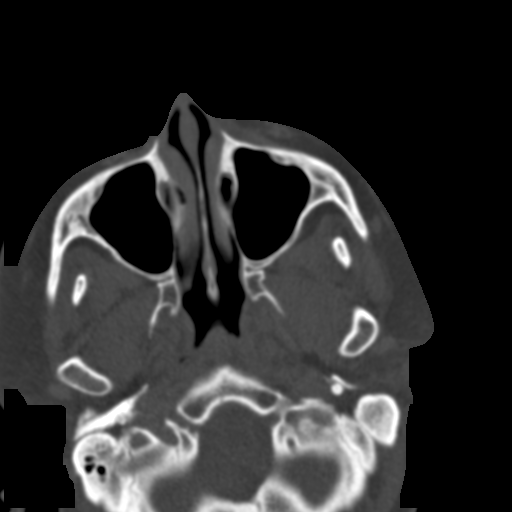
[im 59/96  bone]
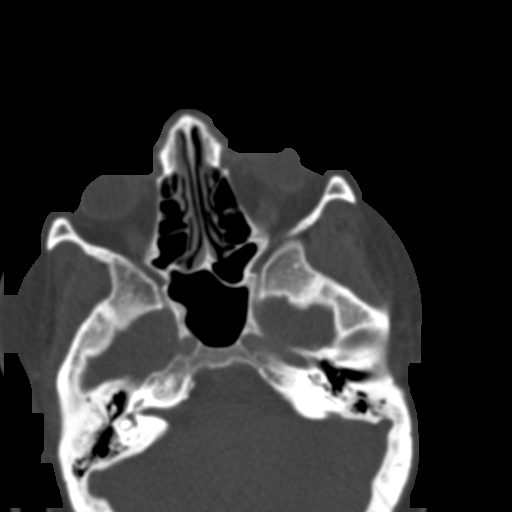
[im 69/96  bone]
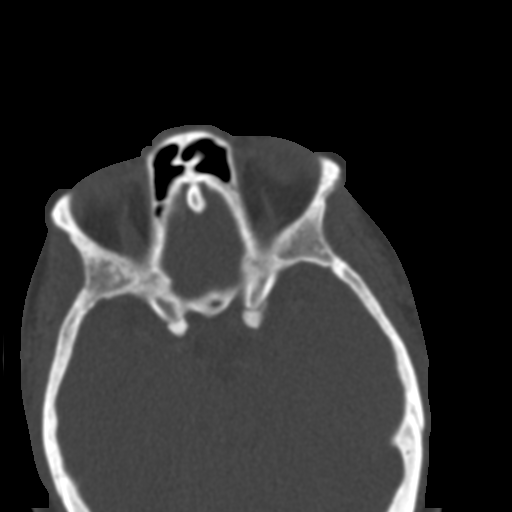
[im 79/96  bone]
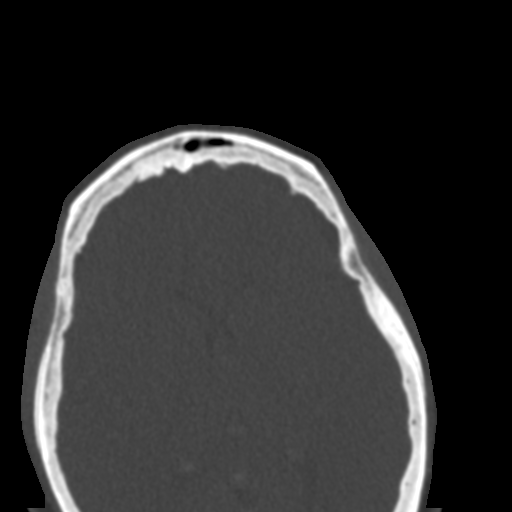
[im 89/96  brain]
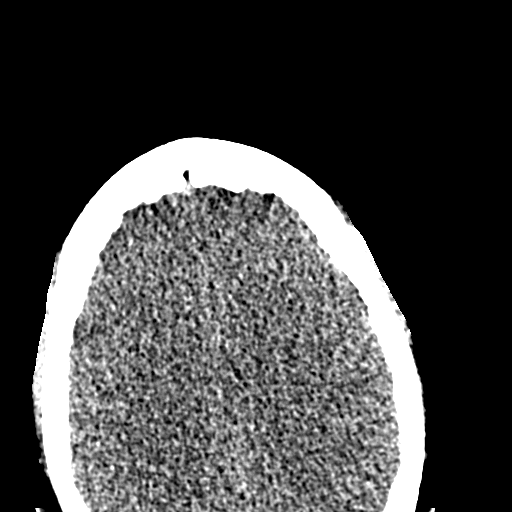
[im 89/96  bone]
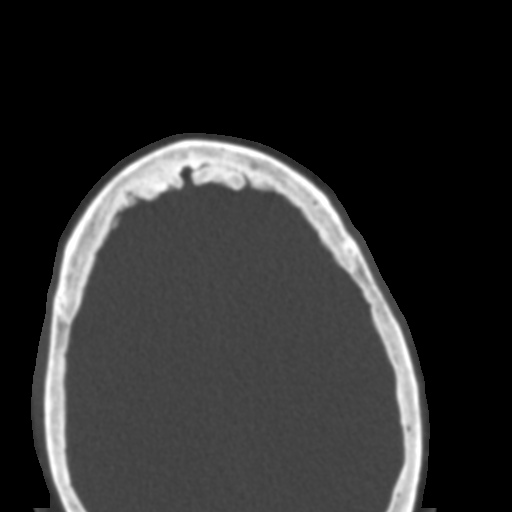

[Series 6: sinus 2.0 mpr cor · coronal · 0.42mm/px · 3 of 80 slices shown]
[im 27/80  bone]
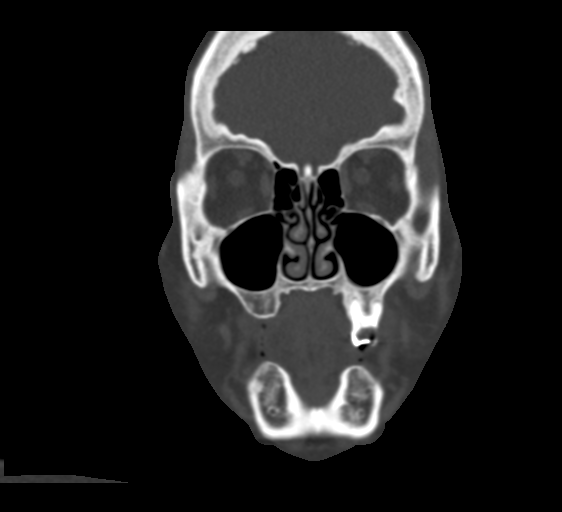
[im 36/80  bone]
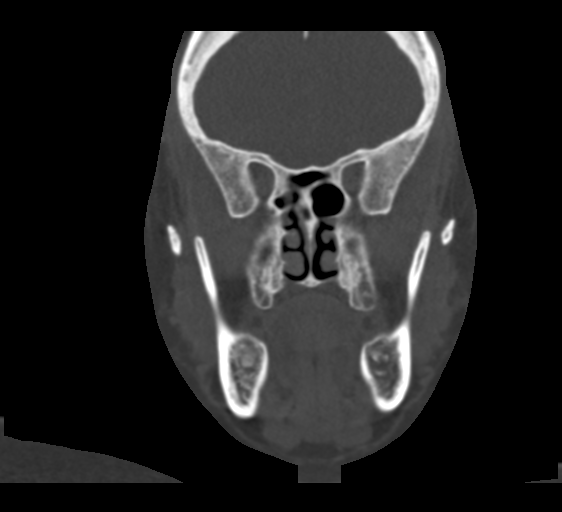
[im 44/80  bone]
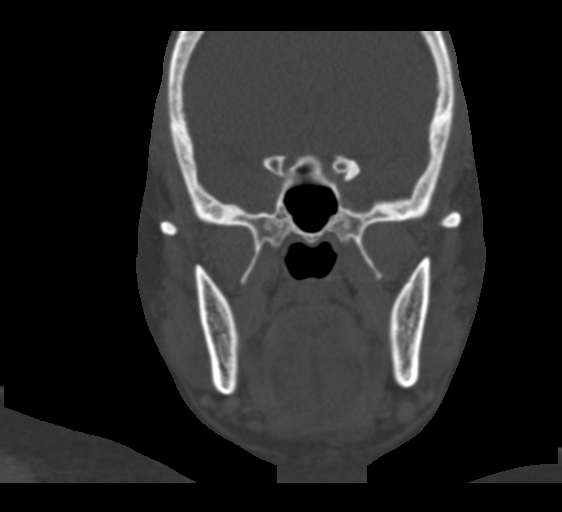

[Series 7: sinus 2.0 mpr sag · sagittal · 0.39mm/px · 3 of 87 slices shown]
[im 29/87  bone]
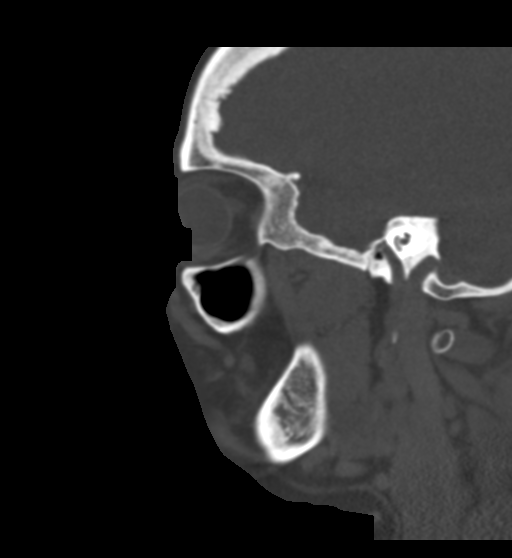
[im 44/87  bone]
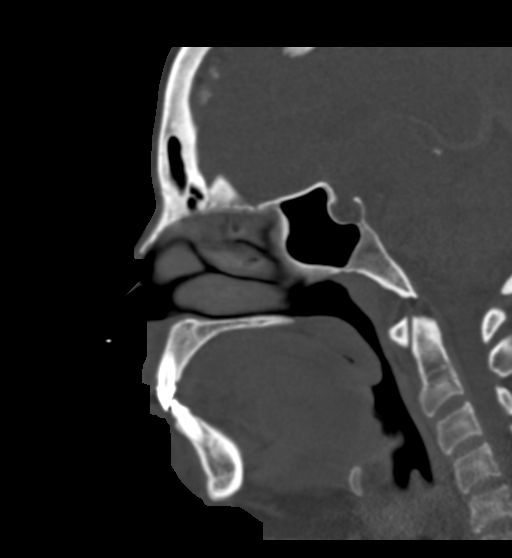
[im 58/87  bone]
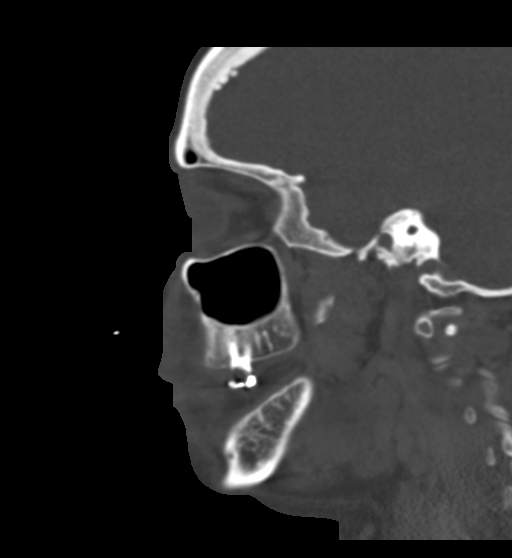

[15 of 47 positions shown; findings below may reference images not displayed]

FINDINGS: Osseous: Mandible intact and normally located. Residual dentition is
carious.

Prominent periapical lucency with cortical breakthrough of the
maxillary alveolar process at the left lateral maxillary incisor
(series 2, image 36).

There is associated regional soft tissue swelling, and possibly a
tiny 3 millimeter subperiosteal abscess (series 7, image 54).

Other osseous structures appear intact. Hyperostosis of the
calvarium, normal variant.

Orbits: Intact orbital walls. Orbits soft tissues appears symmetric
and normal.

Sinuses: Clear throughout.

Soft tissues: Negative visible thyroid, larynx, pharynx,
parapharyngeal spaces, retropharyngeal space, sublingual space,
submandibular spaces, parotid spaces, masticator spaces. Mild
reactive appearing left level 1 lymph nodes. Suboptimal
intravascular contrast but the major vascular structures in the neck
appear to be patent.

Limited intracranial: Possible chronic partially empty sella.
Otherwise negative visible brain parenchyma.
IMPRESSION: 1. Odontogenic infection from the left lateral maxillary incisor.
Regional cellulitis and suspected tiny 3 mm subperiosteal abscess.
See series 7, image 54.
2. Carious residual dentition.  No other acute findings in the face.
